# Patient Record
Sex: Female | Born: 1950
Health system: Southern US, Community
[De-identification: ages and names within clinical notes are randomized; demographics above are authoritative.]

## PROBLEM LIST (undated history)

## (undated) DIAGNOSIS — H269 Unspecified cataract: Secondary | ICD-10-CM

## (undated) DIAGNOSIS — R112 Nausea with vomiting, unspecified: Secondary | ICD-10-CM

## (undated) DIAGNOSIS — Z9889 Other specified postprocedural states: Secondary | ICD-10-CM

## (undated) DIAGNOSIS — T7840XA Allergy, unspecified, initial encounter: Secondary | ICD-10-CM

## (undated) DIAGNOSIS — R42 Dizziness and giddiness: Secondary | ICD-10-CM

## (undated) DIAGNOSIS — K219 Gastro-esophageal reflux disease without esophagitis: Secondary | ICD-10-CM

## (undated) DIAGNOSIS — N189 Chronic kidney disease, unspecified: Secondary | ICD-10-CM

## (undated) DIAGNOSIS — M1990 Unspecified osteoarthritis, unspecified site: Secondary | ICD-10-CM

## (undated) DIAGNOSIS — E785 Hyperlipidemia, unspecified: Secondary | ICD-10-CM

## (undated) HISTORY — PX: POLYPECTOMY: SHX149

## (undated) HISTORY — PX: LITHOTRIPSY: SUR834

## (undated) HISTORY — PX: FACELIFT: SHX1566

## (undated) HISTORY — PX: BACK SURGERY: SHX140

## (undated) HISTORY — DX: Chronic kidney disease, unspecified: N18.9

## (undated) HISTORY — PX: TONSILLECTOMY: SUR1361

## (undated) HISTORY — DX: Unspecified osteoarthritis, unspecified site: M19.90

## (undated) HISTORY — PX: OTHER SURGICAL HISTORY: SHX169

## (undated) HISTORY — PX: BREAST SURGERY: SHX581

## (undated) HISTORY — DX: Unspecified cataract: H26.9

## (undated) HISTORY — DX: Allergy, unspecified, initial encounter: T78.40XA

---

## 1997-09-12 ENCOUNTER — Other Ambulatory Visit: Admission: RE | Admit: 1997-09-12 | Discharge: 1997-09-12 | Payer: Self-pay | Admitting: Obstetrics and Gynecology

## 1998-10-10 ENCOUNTER — Other Ambulatory Visit: Admission: RE | Admit: 1998-10-10 | Discharge: 1998-10-10 | Payer: Self-pay | Admitting: *Deleted

## 1999-12-04 ENCOUNTER — Other Ambulatory Visit: Admission: RE | Admit: 1999-12-04 | Discharge: 1999-12-04 | Payer: Self-pay | Admitting: Obstetrics and Gynecology

## 2001-01-06 ENCOUNTER — Other Ambulatory Visit: Admission: RE | Admit: 2001-01-06 | Discharge: 2001-01-06 | Payer: Self-pay | Admitting: Obstetrics and Gynecology

## 2002-02-01 ENCOUNTER — Other Ambulatory Visit: Admission: RE | Admit: 2002-02-01 | Discharge: 2002-02-01 | Payer: Self-pay | Admitting: Obstetrics and Gynecology

## 2003-02-10 ENCOUNTER — Encounter: Admission: RE | Admit: 2003-02-10 | Discharge: 2003-02-10 | Payer: Self-pay | Admitting: Obstetrics and Gynecology

## 2003-02-10 ENCOUNTER — Encounter: Payer: Self-pay | Admitting: Obstetrics and Gynecology

## 2006-02-26 ENCOUNTER — Ambulatory Visit: Payer: Self-pay | Admitting: Gastroenterology

## 2007-06-26 ENCOUNTER — Ambulatory Visit: Payer: Self-pay | Admitting: Gastroenterology

## 2007-07-08 ENCOUNTER — Ambulatory Visit: Payer: Self-pay | Admitting: Gastroenterology

## 2008-02-05 ENCOUNTER — Encounter: Payer: Self-pay | Admitting: Internal Medicine

## 2008-10-20 ENCOUNTER — Encounter: Admission: RE | Admit: 2008-10-20 | Discharge: 2008-10-20 | Payer: Self-pay | Admitting: Obstetrics and Gynecology

## 2010-11-15 IMAGING — MG MM DIAGNOSTIC BILATERAL
9 series · 9 of 9 positions shown · non-contrast
Comparison: 02/10/2003

CLINICAL DATA: The patient had diffuse burning, stinging in the
right breast pain last week.  This has subsided.  The patient is
considering removal of breast implants.

DIGITAL DIAGNOSTIC BILATERAL MAMMOGRAM WITH CAD

[R CC]
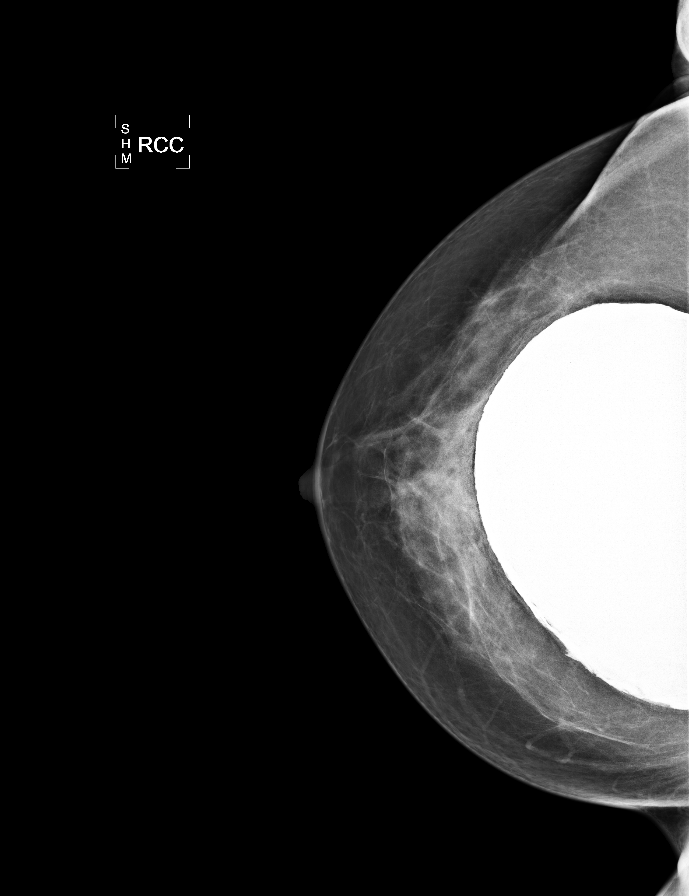

[L CC]
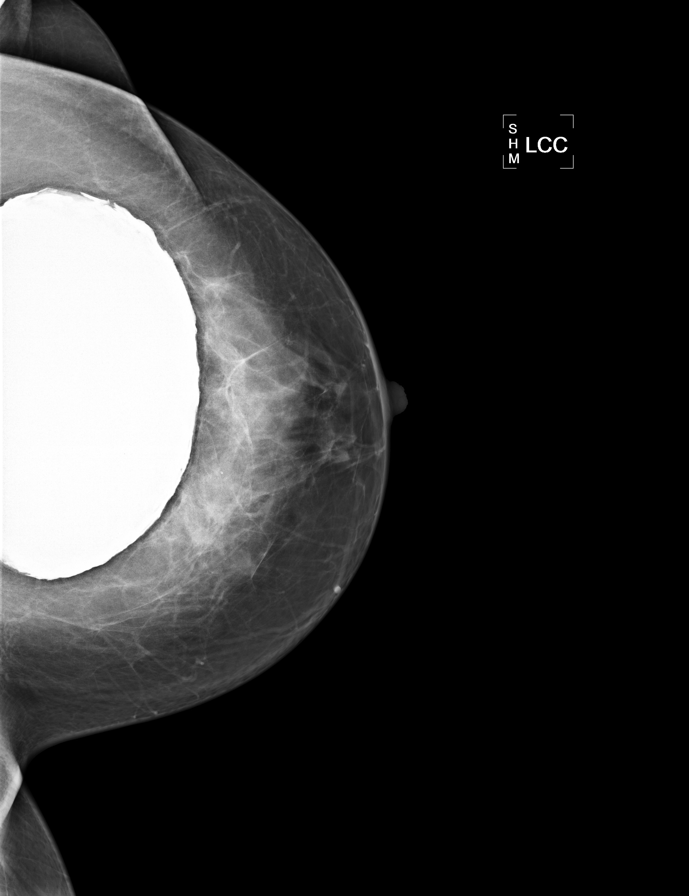

[R MLO]
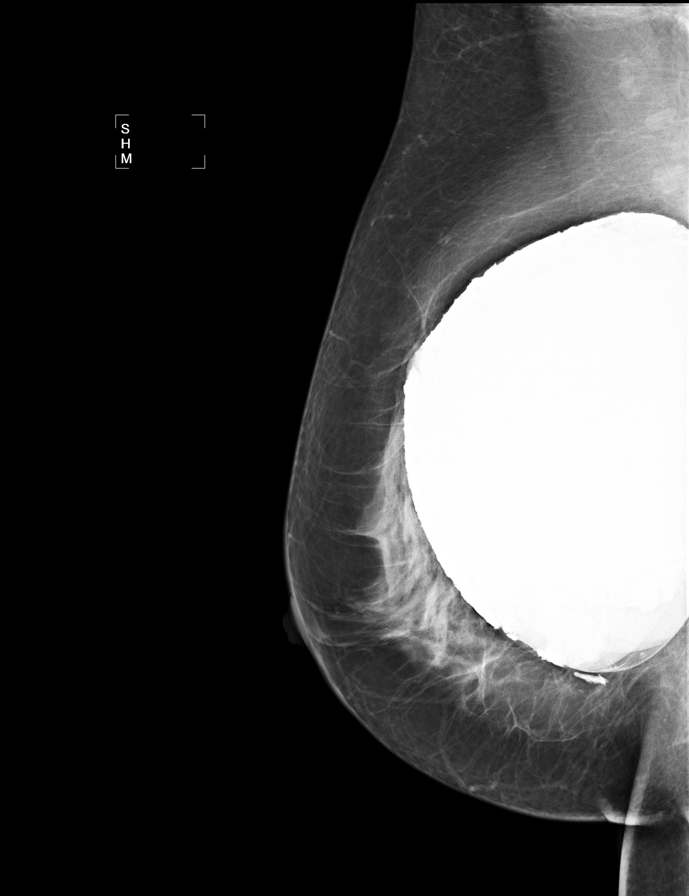

[R CCID]
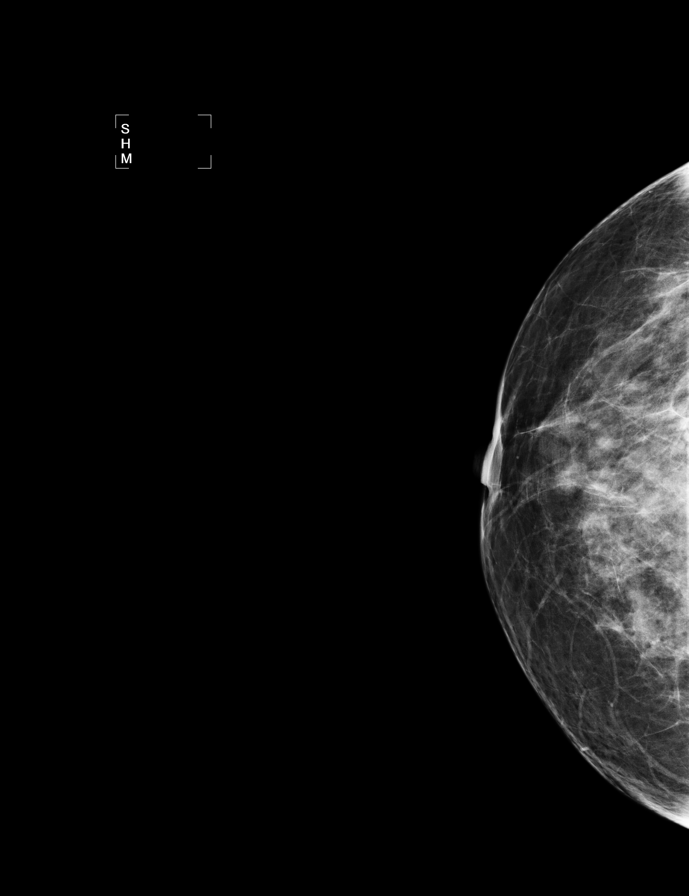

[R MLOID]
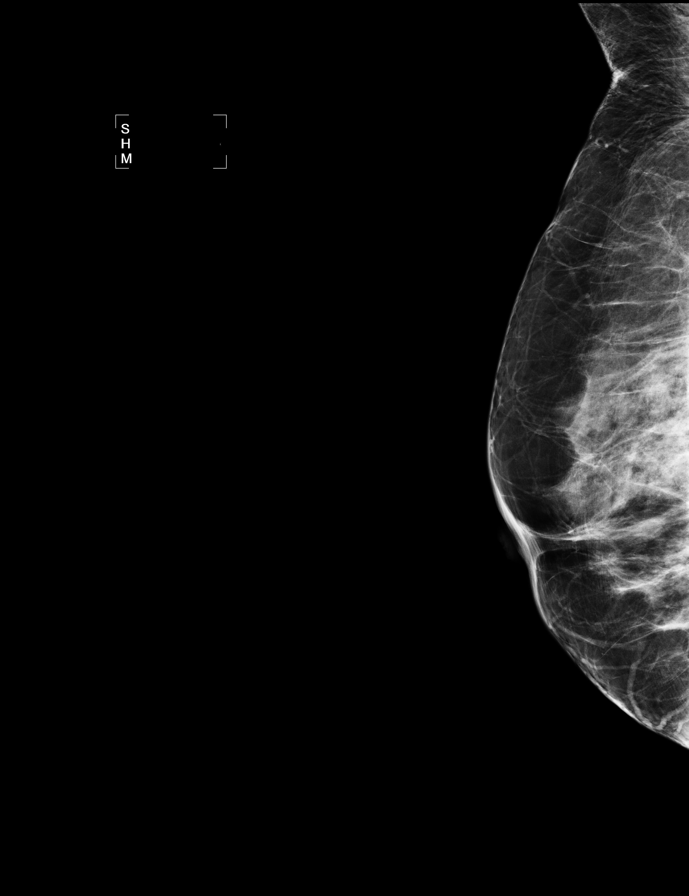

[L CCID]
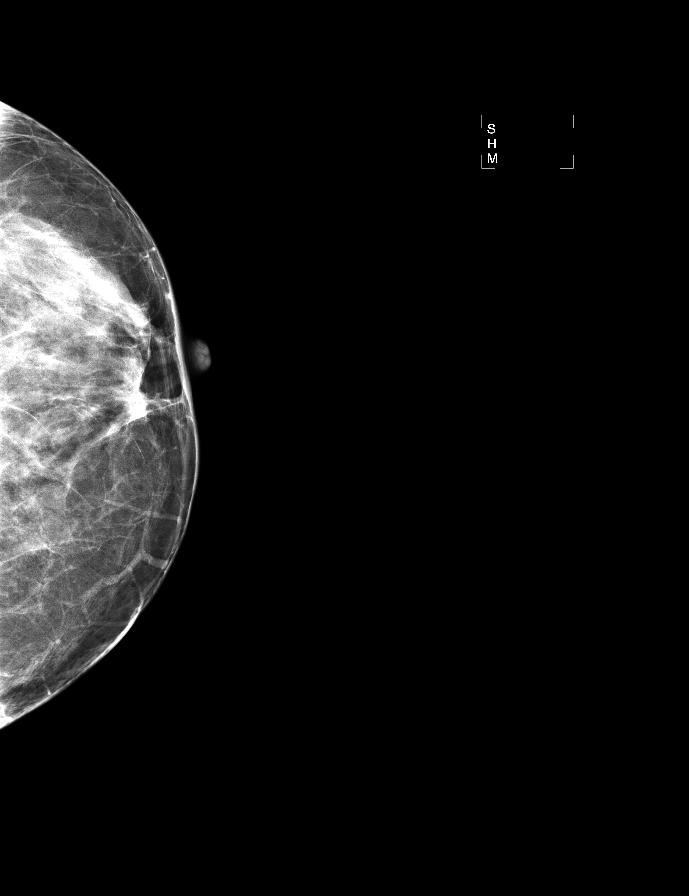

[L MLOID (1 of 2)]
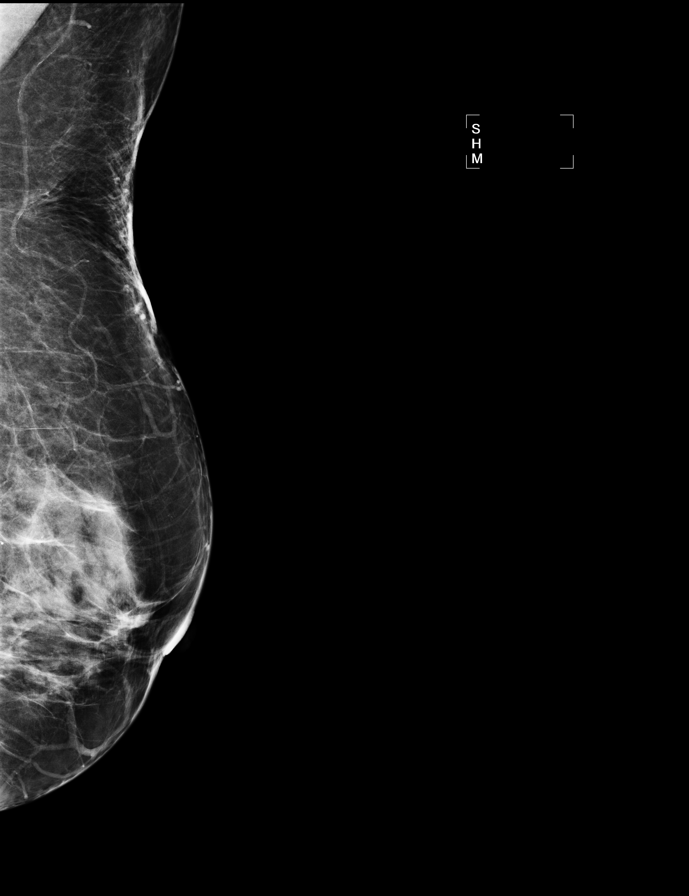

[L MLO]
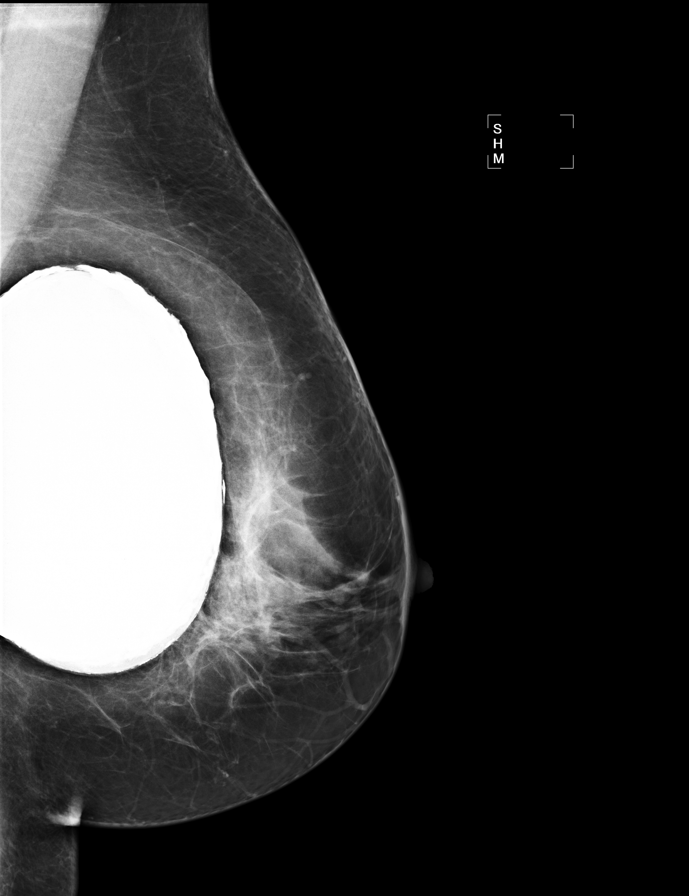

[L MLOID (2 of 2)]
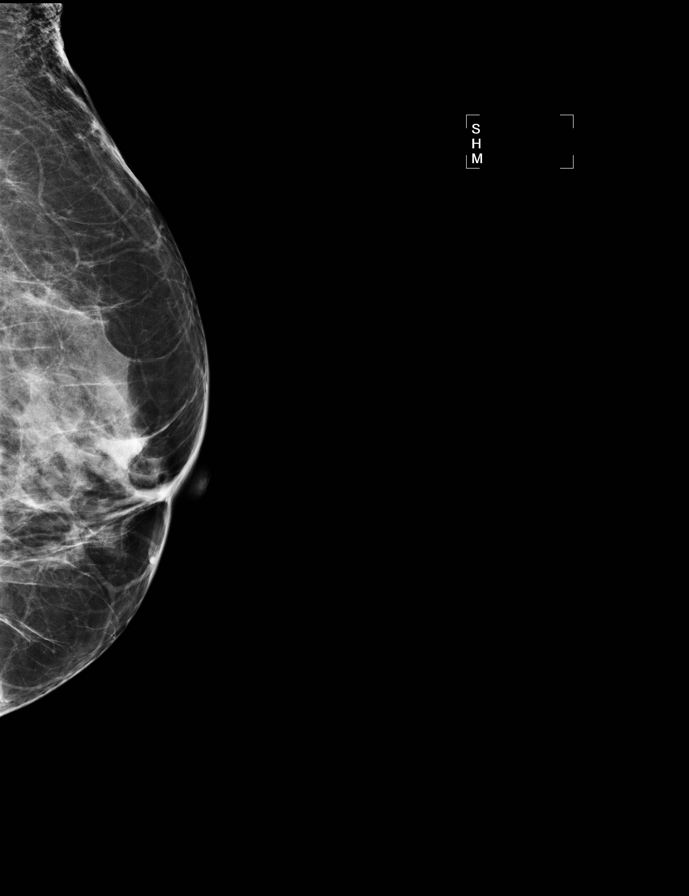

[9 of 9 positions shown; findings below may reference images not displayed]

FINDINGS: The patient has bilateral subglandular implants.  There
is capsular calcification bilaterally. No suspicious mass,
distortion, or microcalcifications are identified to suggest
presence of malignancy.
IMPRESSION: No mammographic evidence for malignancy in either breast.
Screening mammogram is suggested in 1 year.

BI-RADS CATEGORY 1:  Negative.

## 2011-11-25 ENCOUNTER — Other Ambulatory Visit: Payer: Self-pay | Admitting: Urology

## 2011-11-25 ENCOUNTER — Encounter (HOSPITAL_COMMUNITY): Payer: Self-pay | Admitting: Anesthesiology

## 2011-11-25 ENCOUNTER — Ambulatory Visit (HOSPITAL_COMMUNITY)
Admission: RE | Admit: 2011-11-25 | Discharge: 2011-11-25 | Disposition: A | Discharge status: Home / Self Care | Payer: 59 | Source: Ambulatory Visit | Attending: Urology | Admitting: Urology

## 2011-11-25 ENCOUNTER — Encounter (HOSPITAL_COMMUNITY): Payer: Self-pay | Admitting: *Deleted

## 2011-11-25 ENCOUNTER — Encounter (HOSPITAL_COMMUNITY)
Admission: RE | Disposition: A | Discharge status: Home / Self Care | Payer: Self-pay | Source: Ambulatory Visit | Attending: Urology

## 2011-11-25 ENCOUNTER — Ambulatory Visit (HOSPITAL_COMMUNITY): Payer: 59

## 2011-11-25 ENCOUNTER — Ambulatory Visit (HOSPITAL_COMMUNITY): Payer: 59 | Admitting: Anesthesiology

## 2011-11-25 DIAGNOSIS — Z79899 Other long term (current) drug therapy: Secondary | ICD-10-CM | POA: Insufficient documentation

## 2011-11-25 DIAGNOSIS — K219 Gastro-esophageal reflux disease without esophagitis: Secondary | ICD-10-CM | POA: Insufficient documentation

## 2011-11-25 DIAGNOSIS — E785 Hyperlipidemia, unspecified: Secondary | ICD-10-CM | POA: Insufficient documentation

## 2011-11-25 DIAGNOSIS — N201 Calculus of ureter: Secondary | ICD-10-CM | POA: Insufficient documentation

## 2011-11-25 HISTORY — DX: Other specified postprocedural states: R11.2

## 2011-11-25 HISTORY — DX: Hyperlipidemia, unspecified: E78.5

## 2011-11-25 HISTORY — DX: Other specified postprocedural states: Z98.890

## 2011-11-25 HISTORY — DX: Gastro-esophageal reflux disease without esophagitis: K21.9

## 2011-11-25 HISTORY — DX: Dizziness and giddiness: R42

## 2011-11-25 SURGERY — CYSTOSCOPY, WITH STENT INSERTION
Anesthesia: General | Laterality: Right | Wound class: Clean Contaminated

## 2011-11-25 MED ORDER — PROPOFOL 10 MG/ML IV EMUL
INTRAVENOUS | Status: DC | PRN
  Administered 2011-11-25: 200 mg via INTRAVENOUS
  Administered 2011-11-26: 70 mg via INTRAVENOUS

## 2011-11-25 MED ORDER — ONDANSETRON HCL 4 MG/2ML IJ SOLN
INTRAMUSCULAR | Status: DC | PRN
  Administered 2011-11-25: 4 mg via INTRAVENOUS

## 2011-11-25 MED ORDER — BELLADONNA ALKALOIDS-OPIUM 16.2-60 MG RE SUPP
RECTAL | Status: AC
  Filled 2011-11-25: qty 1

## 2011-11-25 MED ORDER — IOHEXOL 300 MG/ML  SOLN
INTRAMUSCULAR | Status: AC
  Filled 2011-11-25: qty 1

## 2011-11-25 MED ORDER — CIPROFLOXACIN IN D5W 400 MG/200ML IV SOLN
INTRAVENOUS | Status: AC
  Filled 2011-11-25: qty 200

## 2011-11-25 MED ORDER — LIDOCAINE HCL 2 % EX GEL
CUTANEOUS | Status: AC
  Filled 2011-11-25: qty 10

## 2011-11-25 MED ORDER — SCOPOLAMINE 1 MG/3DAYS TD PT72
MEDICATED_PATCH | TRANSDERMAL | Status: AC
  Filled 2011-11-25: qty 1

## 2011-11-25 MED ORDER — GLYCOPYRROLATE 0.2 MG/ML IJ SOLN
INTRAMUSCULAR | Status: DC | PRN
  Administered 2011-11-25: .4 mg via INTRAVENOUS

## 2011-11-25 MED ORDER — OXYCODONE-ACETAMINOPHEN 5-325 MG PO TABS: 1.0000 | ORAL_TABLET | ORAL | Status: AC | PRN

## 2011-11-25 MED ORDER — NEOSTIGMINE METHYLSULFATE 1 MG/ML IJ SOLN
INTRAMUSCULAR | Status: DC | PRN
  Administered 2011-11-25: 3.5 mg via INTRAVENOUS

## 2011-11-25 MED ORDER — ROCURONIUM BROMIDE 100 MG/10ML IV SOLN
INTRAVENOUS | Status: DC | PRN
  Administered 2011-11-25: 40 mg via INTRAVENOUS

## 2011-11-25 MED ORDER — CIPROFLOXACIN IN D5W 400 MG/200ML IV SOLN
400.0000 mg | INTRAVENOUS | Status: AC
  Administered 2011-11-25: 400 mg via INTRAVENOUS

## 2011-11-25 MED ORDER — FENTANYL CITRATE 0.05 MG/ML IJ SOLN
INTRAMUSCULAR | Status: DC | PRN
  Administered 2011-11-25: 100 ug via INTRAVENOUS

## 2011-11-25 MED ORDER — CIPROFLOXACIN HCL 500 MG PO TABS: 500.0000 mg | ORAL_TABLET | Freq: Two times a day (BID) | ORAL | Status: DC

## 2011-11-25 MED ORDER — SODIUM CHLORIDE 0.9 % IR SOLN
Status: DC | PRN
  Administered 2011-11-25: 3000 mL

## 2011-11-25 MED ORDER — LACTATED RINGERS IV SOLN
INTRAVENOUS | Status: DC | PRN
  Administered 2011-11-25 – 2011-11-26 (×2): via INTRAVENOUS

## 2011-11-25 MED ORDER — MIDAZOLAM HCL 5 MG/5ML IJ SOLN
INTRAMUSCULAR | Status: DC | PRN
  Administered 2011-11-25: 2 mg via INTRAVENOUS

## 2011-11-25 MED ORDER — LIDOCAINE HCL (CARDIAC) 20 MG/ML IV SOLN
INTRAVENOUS | Status: DC | PRN
  Administered 2011-11-25: 40 mg via INTRAVENOUS

## 2011-11-25 MED ORDER — HYOSCYAMINE SULFATE 0.125 MG PO TABS: 0.1250 mg | ORAL_TABLET | ORAL | Status: DC | PRN

## 2011-11-25 MED ORDER — PHENAZOPYRIDINE HCL 100 MG PO TABS: 100.0000 mg | ORAL_TABLET | Freq: Three times a day (TID) | ORAL | Status: AC | PRN

## 2011-11-25 MED ORDER — MUPIROCIN 2 % EX OINT
TOPICAL_OINTMENT | CUTANEOUS | Status: AC
  Filled 2011-11-25: qty 22

## 2011-11-25 MED ORDER — ONDANSETRON HCL 4 MG PO TABS: 4.0000 mg | ORAL_TABLET | Freq: Three times a day (TID) | ORAL | Status: DC | PRN

## 2011-11-25 MED ORDER — EPHEDRINE SULFATE 50 MG/ML IJ SOLN
INTRAMUSCULAR | Status: DC | PRN
  Administered 2011-11-25: 5 mg via INTRAVENOUS

## 2011-11-25 SURGICAL SUPPLY — 43 items
ADAPTER CATH URET PLST 4-6FR (CATHETERS) IMPLANT
BAG URINE LEG 500ML (DRAIN) ×2 IMPLANT
BAG URO CATCHER STRL LF (DRAPE) ×4 IMPLANT
BASKET LASER NITINOL 1.9FR (BASKET) IMPLANT
BASKET STNLS GEMINI 4WIRE 3FR (BASKET) IMPLANT
BASKET ZERO TIP NITINOL 2.4FR (BASKET) IMPLANT
BOSTON SCIENTIFIC ×2 IMPLANT
BRUSH URET BIOPSY 3F (UROLOGICAL SUPPLIES) IMPLANT
CANISTER SUCT LVC 12 LTR MEDI- (MISCELLANEOUS) IMPLANT
CATH CLEAR GEL 3F BACKSTOP (CATHETERS) IMPLANT
CATH INTERMIT  6FR 70CM (CATHETERS) IMPLANT
CATH URET 5FR 28IN CONE TIP (BALLOONS)
CATH URET 5FR 28IN OPEN ENDED (CATHETERS) ×2 IMPLANT
CATH URET 5FR 70CM CONE TIP (BALLOONS) IMPLANT
CATH URET DUAL LUMEN 6-10FR 50 (CATHETERS) IMPLANT
CLOTH BEACON ORANGE TIMEOUT ST (SAFETY) ×2 IMPLANT
DRAPE CAMERA CLOSED 9X96 (DRAPES) ×2 IMPLANT
ELECT REM PT RETURN 9FT ADLT (ELECTROSURGICAL)
ELECTRODE REM PT RTRN 9FT ADLT (ELECTROSURGICAL) IMPLANT
GLOVE ECLIPSE 7.0 STRL STRAW (GLOVE) ×2 IMPLANT
GLOVE INDICATOR 7.5 STRL GRN (GLOVE) IMPLANT
GLOVE SURG SS PI 8.0 STRL IVOR (GLOVE) IMPLANT
GOWN PREVENTION PLUS LG XLONG (DISPOSABLE) ×2 IMPLANT
GOWN PREVENTION PLUS XLARGE (GOWN DISPOSABLE) IMPLANT
GOWN STRL REIN XL XLG (GOWN DISPOSABLE) ×2 IMPLANT
GUIDEWIRE 0.038 PTFE COATED (WIRE) IMPLANT
GUIDEWIRE ANG ZIPWIRE 035X150 (WIRE) ×2 IMPLANT
GUIDEWIRE ANG ZIPWIRE 038X150 (WIRE) IMPLANT
GUIDEWIRE STR DUAL SENSOR (WIRE) ×2 IMPLANT
IV NS IRRIG 3000ML ARTHROMATIC (IV SOLUTION) ×2 IMPLANT
KIT BALLIN UROMAX 15FX10 (LABEL) IMPLANT
KIT BALLN UROMAX 15FX4 (MISCELLANEOUS) IMPLANT
KIT BALLN UROMAX 26 75X4 (MISCELLANEOUS)
LASER FIBER DISP (UROLOGICAL SUPPLIES) IMPLANT
MANIFOLD NEPTUNE II (INSTRUMENTS) ×2 IMPLANT
MARKER SKIN DUAL TIP RULER LAB (MISCELLANEOUS) IMPLANT
PACK CYSTO (CUSTOM PROCEDURE TRAY) ×2 IMPLANT
PACK CYSTOSCOPY (CUSTOM PROCEDURE TRAY) IMPLANT
SET HIGH PRES BAL DIL (LABEL)
SHEATH URET ACCESS 12FR/35CM (UROLOGICAL SUPPLIES) IMPLANT
SHEATH URET ACCESS 12FR/55CM (UROLOGICAL SUPPLIES) IMPLANT
SYRINGE IRR TOOMEY STRL 70CC (SYRINGE) IMPLANT
TUBING CONNECTING 10 (TUBING) ×2 IMPLANT

## 2011-11-25 NOTE — H&P (Signed)
Urology History and Physical Exam  CC: Right ureter stone  HPI: 61 year old female patient of Dr. Wrenn with a right ureter stone. It is in the distal portion of the ureter. It is 5-6mm in size. It is associated with flank pain. Her UA in clinic today was negative for signs of infection. She has severe nausea. She was seen in clinic today and management options were reviewed. She presents for cystoscopy, right ureteroscopy, laser lithotripsy, right ureter stent placement.  We have discussed the risks, benefits, alternatives, and likelihood of achieving her goals.  PMH: Past Medical History  Diagnosis Date  . PONV (postoperative nausea and vomiting)   . GERD (gastroesophageal reflux disease)   . Vertigo, intermittent     s/p head injury d/t fall 9yrs ago  . Hyperlipemia     PSH: Past Surgical History  Procedure Date  . Back surgery     Lumbar fusion 10yrs  . Tonsillectomy     age 5  . Breast surgery     augmentation 20yrs ago  . Facelift     15 yrs ago  . Lithotripsy     Allergies: Allergies  Allergen Reactions  . Codeine     Medications: Prescriptions prior to admission  Medication Sig Dispense Refill  . fish oil-omega-3 fatty acids 1000 MG capsule Take 1 g by mouth daily.      . HYDROcodone-acetaminophen (VICODIN) 5-500 MG per tablet Take 1 tablet by mouth every 6 (six) hours as needed. For pain.      . naproxen sodium (ANAPROX) 220 MG tablet Take 220 mg by mouth 2 (two) times daily as needed. For pain.      . ondansetron (ZOFRAN) 4 MG tablet Take 4 mg by mouth every 8 (eight) hours as needed.      . OVER THE COUNTER MEDICATION Take 1 tablet by mouth daily. Probiotic gummi.      . Tamsulosin HCl (FLOMAX) 0.4 MG CAPS Take 0.4 mg by mouth daily.      . vitamin B-12 (CYANOCOBALAMIN) 1000 MCG tablet Take 1,000 mcg by mouth daily.         Social History: History   Social History  . Marital Status: Married    Spouse Name: N/A    Number of Children: N/A  . Years of  Education: N/A   Occupational History  . Not on file.   Social History Main Topics  . Smoking status: Never Smoker   . Smokeless tobacco: Not on file  . Alcohol Use: 0.6 oz/week    1 Glasses of wine per week  . Drug Use: No  . Sexually Active:    Other Topics Concern  . Not on file   Social History Narrative  . No narrative on file    Family History: History reviewed. No pertinent family history.  Review of Systems: Positive: Nausea, emesis. Negative: Fever, Chest Pain, SOB.  A further 10 point review of systems was negative except what is listed in the HPI.  Physical Exam:  General: No acute distress.  Awake. Head:  Normocephalic.  Atraumatic. ENT:  EOMI.  Mucous membranes moist Neck:  Supple.  No lymphadenopathy. CV:  S1 present. S2 present. Regular rate. Pulmonary: Equal effort bilaterally.  Clear to auscultation bilaterally. Abdomen: Soft.  Non- tender to palpation. Skin:  Normal turgor.  No visible rash. Extremity: No gross deformity of bilateral upper extremities.  No gross deformity of    bilateral lower extremities. Neurologic: Alert. Appropriate mood.     Studies:  No results found for this basename: HGB:2,WBC:2,PLT:2 in the last 72 hours  No results found for this basename: NA:2,K:2,CL:2,CO2:2,BUN:2,CREATININE:2,CALCIUM:2,MAGNESIUM:2,GFRNONAA:2,GFRAA:2 in the last 72 hours   No results found for this basename: PT:2,INR:2,APTT:2 in the last 72 hours   No components found with this basename: ABG:2    Assessment:  Right distal ureter stone.  Plan: -To OR for cystoscopy, right ureteroscopy, laser lithotripsy, right ureter stent placement.   

## 2011-11-25 NOTE — Anesthesia Preprocedure Evaluation (Signed)
Anesthesia Evaluation  Patient identified by MRN, date of birth, ID band Patient awake    Reviewed: Allergy & Precautions, H&P , NPO status , Patient's Chart, lab work & pertinent test results  History of Anesthesia Complications (+) PONV  Airway Mallampati: II TM Distance: >3 FB Neck ROM: Full    Dental  (+) Teeth Intact and Dental Advisory Given   Pulmonary neg pulmonary ROS,  breath sounds clear to auscultation        Cardiovascular negative cardio ROS  Rhythm:Regular Rate:Normal     Neuro/Psych Hx of closed head injury in the remote past; negative residual negative psych ROS   GI/Hepatic Neg liver ROS, GERD-  Medicated,  Endo/Other  negative endocrine ROS  Renal/GU negative Renal ROS  negative genitourinary   Musculoskeletal negative musculoskeletal ROS (+)   Abdominal   Peds  Hematology negative hematology ROS (+)   Anesthesia Other Findings   Reproductive/Obstetrics negative OB ROS                           Anesthesia Physical Anesthesia Plan  ASA: II and Emergent  Anesthesia Plan: General   Post-op Pain Management:    Induction: Intravenous  Airway Management Planned: LMA  Additional Equipment:   Intra-op Plan:   Post-operative Plan: Extubation in OR  Informed Consent: I have reviewed the patients History and Physical, chart, labs and discussed the procedure including the risks, benefits and alternatives for the proposed anesthesia with the patient or authorized representative who has indicated his/her understanding and acceptance.   Dental advisory given  Plan Discussed with: CRNA  Anesthesia Plan Comments:         Anesthesia Quick Evaluation

## 2011-11-25 NOTE — Brief Op Note (Signed)
11/25/2011  11:49 PM  PATIENT:  Amber [name]  61 y.o. female  PRE-OPERATIVE DIAGNOSIS:  Right ureteral stone  POST-OPERATIVE DIAGNOSIS:  Right Ureteral stone  PROCEDURE:  Procedure(s) (LRB): CYSTOSCOPY WITH STENT PLACEMENT (Right)  SURGEON:  Surgeon(s) and Role:    * Milford Cage, MD - Primary  PHYSICIAN ASSISTANT:   ASSISTANTS: none   ANESTHESIA:   general  EBL:     BLOOD ADMINISTERED:none  DRAINS: none   LOCAL MEDICATIONS USED:  NONE  SPECIMEN:  No Specimen  DISPOSITION OF SPECIMEN:  N/A  COUNTS:  YES  TOURNIQUET:  * No tourniquets in log *  DICTATION: .Other Dictation: Dictation Number [id number]  PLAN OF CARE: Discharge to home after PACU  PATIENT DISPOSITION:  PACU - hemodynamically stable.   Delay start of Pharmacological VTE agent (>24hrs) due to surgical blood loss or risk of bleeding: no

## 2011-11-26 ENCOUNTER — Other Ambulatory Visit: Payer: Self-pay | Admitting: Urology

## 2011-11-26 MED ORDER — PROMETHAZINE HCL 25 MG/ML IJ SOLN: 6.2500 mg | INTRAMUSCULAR | Status: DC | PRN

## 2011-11-26 MED ORDER — LACTATED RINGERS IV SOLN: INTRAVENOUS | Status: DC

## 2011-11-26 MED ORDER — FENTANYL CITRATE 0.05 MG/ML IJ SOLN: 25.0000 ug | INTRAMUSCULAR | Status: DC | PRN

## 2011-11-26 NOTE — Op Note (Signed)
NAME:  Amber [name], Amber [name] NO.:  [id number]  MEDICAL RECORD NO.:  [id number]  LOCATION:  [facility]                         FACILITY:  [facility]  PHYSICIAN:  Natalia Leatherwood, MD    DATE OF BIRTH:  02/25/1951  DATE OF PROCEDURE:  11/25/2011 DATE OF DISCHARGE:                              OPERATIVE REPORT   SURGEON:  Natalia Leatherwood, MD  ASSISTANT:  None.  PREOPERATIVE DIAGNOSIS:  Right ureteral stone.  POSTOP DIAGNOSIS:  Right ureteral stone.  PROCEDURE PERFORMED: 1. Cystoscopy. 2. Right ureteral stent placement. 3. Fluoroscopy with interpretation less than 1 hour.  FINDINGS:  Very narrowed distal ureter with difficulty placing wire up right ureteral orifice.  COMPLICATIONS:  None.  DRAINS:  None.  SPECIMEN:  None.  HISTORY OF PRESENT ILLNESS:  This is a 61 year old female, patient of Dr. Annabell Howells who has a history of urolithiasis.  She presented with a right distal ureter stone.  She was seen in clinic today and has recommended that she have attempted ureteroscopy, she presented to the hospital this evening for that procedure.  We discussed the risks, benefits, alternatives, and likelihood of achieving goals.  PROCEDURE:  After informed consent was obtained, the patient was taken to the operating room.  She was placed in a supine position.  IV antibiotics were infused and general anesthesia was induced.  PAS hose were in place and turned on prior to induction of anesthesia.  She was then placed in dorsal lithotomy position making sure to pad all pertinent neurovascular pressure points appropriately.  Time-out was performed in which the correct patient, surgical site, and procedure were identified and agreed upon by the team.  Next, a cystoscope was advanced through the urethra into the bladder.  The ureteral orifice was visible on the right side was noted to be a very small ureteral orifice. It was attempted to be cannulated with a sensor tip wire but this  was unsuccessful likely due to edema.  Next, I had to get an angled-tip glidewire, and after trying for quite some time, I was able to pass the glidewire past the ureteral orifice and up into the right renal pelvis on fluoroscopy.  I attempted to place a 5-French ureteral catheter over this to obtain a retrograde pyelogram; however, because of the end of that was not tapered, it appeared that this would injure the distal ureter.  Therefore, I have deferred and instead opted to place a 6 x 24 double-J ureteral stent without the strings in place.  This was placed up over the wire without the strings in place.  Good curl was noted in the right renal pelvis under fluoroscopy and a curl was noted in the bladder on direct visualization.  The bladder was drained.  The patient was placed back in supine position. Anesthesia was reversed.  She was taken to the PACU in stable condition. She will be given antibiotics to cover for the instrumentation and will follow up with Dr. Annabell Howells for definitive management of her stone.          ______________________________ Natalia Leatherwood, MD     DW/MEDQ  D:  11/25/2011  T:  11/26/2011  Job:  [id number]

## 2011-11-26 NOTE — Anesthesia Postprocedure Evaluation (Signed)
Anesthesia Post Note  Patient: Amber [name]  Procedure(s) Performed: Procedure(s) (LRB): CYSTOSCOPY WITH STENT PLACEMENT (Right)  Anesthesia type: General  Patient location: PACU  Post pain: Pain level controlled  Post assessment: Post-op Vital signs reviewed  Last Vitals:  Filed Vitals:   11/26/11 0100  BP: 98/64  Pulse: 73  Temp: 36.4 C  Resp: 18    Post vital signs: Reviewed  Level of consciousness: sedated  Complications: No apparent anesthesia complications

## 2011-11-26 NOTE — Transfer of Care (Signed)
Immediate Anesthesia Transfer of Care Note  Patient: Amber [name]  Procedure(s) Performed: Procedure(s) (LRB): CYSTOSCOPY WITH STENT PLACEMENT (Right)  Patient Location: PACU  Anesthesia Type: General  Level of Consciousness: awake, alert , oriented, patient cooperative and responds to stimulation  Airway & Oxygen Therapy: Patient Spontanous Breathing and Patient connected to face mask oxygen  Post-op Assessment: Report given to PACU RN, Post -op Vital signs reviewed and stable and Patient moving all extremities X 4  Post vital signs: stable  Complications: No apparent anesthesia complications

## 2011-11-27 ENCOUNTER — Encounter (HOSPITAL_COMMUNITY): Payer: Self-pay | Admitting: Pharmacy Technician

## 2011-12-02 ENCOUNTER — Encounter (HOSPITAL_COMMUNITY)
Admission: RE | Admit: 2011-12-02 | Discharge: 2011-12-02 | Disposition: A | Discharge status: Home / Self Care | Payer: 59 | Source: Ambulatory Visit | Attending: Urology | Admitting: Urology

## 2011-12-02 ENCOUNTER — Encounter (HOSPITAL_COMMUNITY): Payer: Self-pay

## 2011-12-02 LAB — CBC
Hemoglobin: 13.7 g/dL (ref 12.0–15.0)
MCH: 28.1 pg (ref 26.0–34.0)
MCHC: 32.4 g/dL (ref 30.0–36.0)
RDW: 14 % (ref 11.5–15.5)

## 2011-12-02 LAB — SURGICAL PCR SCREEN
MRSA, PCR: NEGATIVE
Staphylococcus aureus: POSITIVE — AB

## 2011-12-02 NOTE — Patient Instructions (Addendum)
20 Amber [name]  12/02/2011   Your procedure is scheduled on:  12-03-2011  Report to [facility] at 0630  AM.  Call this number if you have problems the morning of surgery: [phone]   Remember:   Do not eat food or drink liquids:After Midnight.  .  Take these medicines the morning of surgery with A SIP OF WATER:flomax, oxycodone    Do not wear jewelry or make up.  Do not wear lotions, powders, or perfumes.Do not wear deodorant.    Do not bring valuables to the hospital.  Contacts, dentures or bridgework may not be worn into surgery.  Leave suitcase in the car. After surgery it may be brought to your room.  For patients admitted to the hospital, checkout time is 11:00 AM the day of             discharge                             Special Instructions: CHG Shower Use Special Wash: 1/2 bottle night before surgery and 1/2 bottle morning of surgery, use regular soap on face and front and back private area.   Please read over the following fact sheets that you were given: MRSA Information  [facility] pre op nurse phone number [phone], call if needed

## 2011-12-02 NOTE — Pre-Procedure Instructions (Signed)
ekg dr bonsu on chart

## 2011-12-03 ENCOUNTER — Encounter (HOSPITAL_COMMUNITY)
Admission: RE | Disposition: A | Discharge status: Home / Self Care | Payer: Self-pay | Source: Ambulatory Visit | Attending: Urology

## 2011-12-03 ENCOUNTER — Ambulatory Visit (HOSPITAL_COMMUNITY): Payer: 59 | Admitting: Anesthesiology

## 2011-12-03 ENCOUNTER — Encounter (HOSPITAL_COMMUNITY): Payer: Self-pay | Admitting: Anesthesiology

## 2011-12-03 ENCOUNTER — Ambulatory Visit (HOSPITAL_COMMUNITY)
Admission: RE | Admit: 2011-12-03 | Discharge: 2011-12-03 | Disposition: A | Discharge status: Home / Self Care | Payer: 59 | Source: Ambulatory Visit | Attending: Urology | Admitting: Urology

## 2011-12-03 ENCOUNTER — Encounter (HOSPITAL_COMMUNITY): Payer: Self-pay | Admitting: *Deleted

## 2011-12-03 DIAGNOSIS — N201 Calculus of ureter: Secondary | ICD-10-CM | POA: Insufficient documentation

## 2011-12-03 DIAGNOSIS — K219 Gastro-esophageal reflux disease without esophagitis: Secondary | ICD-10-CM | POA: Insufficient documentation

## 2011-12-03 DIAGNOSIS — E785 Hyperlipidemia, unspecified: Secondary | ICD-10-CM | POA: Insufficient documentation

## 2011-12-03 DIAGNOSIS — R42 Dizziness and giddiness: Secondary | ICD-10-CM | POA: Insufficient documentation

## 2011-12-03 DIAGNOSIS — Z79899 Other long term (current) drug therapy: Secondary | ICD-10-CM | POA: Insufficient documentation

## 2011-12-03 DIAGNOSIS — Z01812 Encounter for preprocedural laboratory examination: Secondary | ICD-10-CM | POA: Insufficient documentation

## 2011-12-03 HISTORY — PX: CYSTOSCOPY/RETROGRADE/URETEROSCOPY/STONE EXTRACTION WITH BASKET: SHX5317

## 2011-12-03 SURGERY — CYSTOSCOPY, WITH CALCULUS REMOVAL USING BASKET
Anesthesia: General | Site: Ureter | Laterality: Right | Wound class: Clean Contaminated

## 2011-12-03 MED ORDER — INDIGOTINDISULFONATE SODIUM 8 MG/ML IJ SOLN
INTRAMUSCULAR | Status: AC
  Filled 2011-12-03: qty 5

## 2011-12-03 MED ORDER — FENTANYL CITRATE 0.05 MG/ML IJ SOLN
INTRAMUSCULAR | Status: DC | PRN
  Administered 2011-12-03: 50 ug via INTRAVENOUS
  Administered 2011-12-03: 25 ug via INTRAVENOUS

## 2011-12-03 MED ORDER — OXYCODONE HCL 5 MG PO TABS
ORAL_TABLET | ORAL | Status: AC
  Filled 2011-12-03: qty 1

## 2011-12-03 MED ORDER — ONDANSETRON HCL 4 MG/2ML IJ SOLN
INTRAMUSCULAR | Status: DC | PRN
  Administered 2011-12-03: 4 mg via INTRAVENOUS

## 2011-12-03 MED ORDER — LIDOCAINE HCL 2 % EX GEL
CUTANEOUS | Status: AC
  Filled 2011-12-03: qty 10

## 2011-12-03 MED ORDER — BELLADONNA ALKALOIDS-OPIUM 16.2-60 MG RE SUPP
RECTAL | Status: DC | PRN
  Administered 2011-12-03: 1 via RECTAL

## 2011-12-03 MED ORDER — MIDAZOLAM HCL 5 MG/5ML IJ SOLN
INTRAMUSCULAR | Status: DC | PRN
  Administered 2011-12-03: 2 mg via INTRAVENOUS

## 2011-12-03 MED ORDER — ACETAMINOPHEN 10 MG/ML IV SOLN
INTRAVENOUS | Status: DC | PRN
  Administered 2011-12-03: 1000 mg via INTRAVENOUS

## 2011-12-03 MED ORDER — SODIUM CHLORIDE 0.9 % IJ SOLN: 3.0000 mL | Freq: Two times a day (BID) | INTRAMUSCULAR | Status: DC

## 2011-12-03 MED ORDER — SODIUM CHLORIDE 0.9 % IJ SOLN: 3.0000 mL | INTRAMUSCULAR | Status: DC | PRN

## 2011-12-03 MED ORDER — ONDANSETRON HCL 4 MG/2ML IJ SOLN: 4.0000 mg | Freq: Four times a day (QID) | INTRAMUSCULAR | Status: DC | PRN

## 2011-12-03 MED ORDER — CIPROFLOXACIN IN D5W 400 MG/200ML IV SOLN
INTRAVENOUS | Status: AC
  Filled 2011-12-03: qty 200

## 2011-12-03 MED ORDER — EPHEDRINE SULFATE 50 MG/ML IJ SOLN
INTRAMUSCULAR | Status: DC | PRN
  Administered 2011-12-03: 5 mg via INTRAVENOUS

## 2011-12-03 MED ORDER — LACTATED RINGERS IV SOLN
INTRAVENOUS | Status: DC
  Administered 2011-12-03: 1000 mL via INTRAVENOUS

## 2011-12-03 MED ORDER — ACETAMINOPHEN 650 MG RE SUPP
650.0000 mg | RECTAL | Status: DC | PRN
  Filled 2011-12-03: qty 1

## 2011-12-03 MED ORDER — OXYCODONE HCL 5 MG PO TABS
5.0000 mg | ORAL_TABLET | ORAL | Status: DC | PRN
  Administered 2011-12-03 (×2): 5 mg via ORAL

## 2011-12-03 MED ORDER — LIDOCAINE HCL (CARDIAC) 20 MG/ML IV SOLN
INTRAVENOUS | Status: DC | PRN
  Administered 2011-12-03: 50 mg via INTRAVENOUS

## 2011-12-03 MED ORDER — SODIUM CHLORIDE 0.9 % IV SOLN: 250.0000 mL | INTRAVENOUS | Status: DC | PRN

## 2011-12-03 MED ORDER — ACETAMINOPHEN 10 MG/ML IV SOLN
INTRAVENOUS | Status: AC
  Filled 2011-12-03: qty 100

## 2011-12-03 MED ORDER — BELLADONNA ALKALOIDS-OPIUM 16.2-60 MG RE SUPP
RECTAL | Status: AC
  Filled 2011-12-03: qty 1

## 2011-12-03 MED ORDER — IOHEXOL 300 MG/ML  SOLN
INTRAMUSCULAR | Status: AC
Start: 2011-12-03 — End: 2011-12-03
  Filled 2011-12-03: qty 1

## 2011-12-03 MED ORDER — FENTANYL CITRATE 0.05 MG/ML IJ SOLN: 25.0000 ug | INTRAMUSCULAR | Status: DC | PRN

## 2011-12-03 MED ORDER — SODIUM CHLORIDE 0.9 % IR SOLN
Status: DC | PRN
  Administered 2011-12-03: 1

## 2011-12-03 MED ORDER — DEXAMETHASONE SODIUM PHOSPHATE 4 MG/ML IJ SOLN
INTRAMUSCULAR | Status: DC | PRN
  Administered 2011-12-03: 10 mg via INTRAVENOUS

## 2011-12-03 MED ORDER — PROPOFOL 10 MG/ML IV EMUL
INTRAVENOUS | Status: DC | PRN
  Administered 2011-12-03: 125 mg via INTRAVENOUS

## 2011-12-03 MED ORDER — ACETAMINOPHEN 325 MG PO TABS: 650.0000 mg | ORAL_TABLET | ORAL | Status: DC | PRN

## 2011-12-03 MED ORDER — CIPROFLOXACIN IN D5W 400 MG/200ML IV SOLN
400.0000 mg | INTRAVENOUS | Status: AC
  Administered 2011-12-03: 400 mg via INTRAVENOUS

## 2011-12-03 SURGICAL SUPPLY — 13 items
BAG URO CATCHER STRL LF (DRAPE) ×2 IMPLANT
BASKET LASER NITINOL 1.9FR (BASKET) ×2 IMPLANT
CATH URET 5FR 28IN OPEN ENDED (CATHETERS) ×2 IMPLANT
CLOTH BEACON ORANGE TIMEOUT ST (SAFETY) ×2 IMPLANT
DRAPE CAMERA CLOSED 9X96 (DRAPES) ×2 IMPLANT
GLOVE SURG SS PI 8.0 STRL IVOR (GLOVE) ×2 IMPLANT
GOWN PREVENTION PLUS XLARGE (GOWN DISPOSABLE) ×2 IMPLANT
GOWN STRL REIN XL XLG (GOWN DISPOSABLE) ×2 IMPLANT
LASER FIBER DISP (UROLOGICAL SUPPLIES) ×2 IMPLANT
MANIFOLD NEPTUNE II (INSTRUMENTS) ×2 IMPLANT
MARKER SKIN DUAL TIP RULER LAB (MISCELLANEOUS) IMPLANT
PACK CYSTO (CUSTOM PROCEDURE TRAY) ×2 IMPLANT
TUBING CONNECTING 10 (TUBING) ×2 IMPLANT

## 2011-12-03 NOTE — Anesthesia Preprocedure Evaluation (Signed)
Anesthesia Evaluation  Patient identified by MRN, date of birth, ID band Patient awake    Reviewed: Allergy & Precautions, H&P , NPO status , Patient's Chart, lab work & pertinent test results  Airway Mallampati: II TM Distance: <3 FB Neck ROM: Full    Dental No notable dental hx.    Pulmonary neg pulmonary ROS,  breath sounds clear to auscultation  Pulmonary exam normal       Cardiovascular negative cardio ROS  Rhythm:Regular Rate:Normal     Neuro/Psych negative neurological ROS  negative psych ROS   GI/Hepatic negative GI ROS, Neg liver ROS,   Endo/Other  negative endocrine ROS  Renal/GU negative Renal ROS  negative genitourinary   Musculoskeletal negative musculoskeletal ROS (+)   Abdominal   Peds negative pediatric ROS (+)  Hematology negative hematology ROS (+)   Anesthesia Other Findings   Reproductive/Obstetrics negative OB ROS                           Anesthesia Physical Anesthesia Plan  ASA: I  Anesthesia Plan: General   Post-op Pain Management:    Induction: Intravenous  Airway Management Planned: LMA  Additional Equipment:   Intra-op Plan:   Post-operative Plan:   Informed Consent: I have reviewed the patients History and Physical, chart, labs and discussed the procedure including the risks, benefits and alternatives for the proposed anesthesia with the patient or authorized representative who has indicated his/her understanding and acceptance.   Dental advisory given  Plan Discussed with: CRNA  Anesthesia Plan Comments:         Anesthesia Quick Evaluation

## 2011-12-03 NOTE — Preoperative (Signed)
Beta Blockers   Reason not to administer Beta Blockers:Not Applicable 

## 2011-12-03 NOTE — H&P (View-Only) (Signed)
Urology History and Physical Exam  CC: Right ureter stone  HPI: 61 year old female patient of Dr. Annabell Howells with a right ureter stone. It is in the distal portion of the ureter. It is 5-18mm in size. It is associated with flank pain. Her UA in clinic today was negative for signs of infection. She has severe nausea. She was seen in clinic today and management options were reviewed. She presents for cystoscopy, right ureteroscopy, laser lithotripsy, right ureter stent placement.  We have discussed the risks, benefits, alternatives, and likelihood of achieving her goals.  PMH: Past Medical History  Diagnosis Date  . PONV (postoperative nausea and vomiting)   . GERD (gastroesophageal reflux disease)   . Vertigo, intermittent     s/p head injury d/t fall 44yrs ago  . Hyperlipemia     PSH: Past Surgical History  Procedure Date  . Back surgery     Lumbar fusion 67yrs  . Tonsillectomy     age 46  . Breast surgery     augmentation 59yrs ago  . Facelift     15 yrs ago  . Lithotripsy     Allergies: Allergies  Allergen Reactions  . Codeine     Medications: Prescriptions prior to admission  Medication Sig Dispense Refill  . fish oil-omega-3 fatty acids 1000 MG capsule Take 1 g by mouth daily.      Marland Kitchen HYDROcodone-acetaminophen (VICODIN) 5-500 MG per tablet Take 1 tablet by mouth every 6 (six) hours as needed. For pain.      . naproxen sodium (ANAPROX) 220 MG tablet Take 220 mg by mouth 2 (two) times daily as needed. For pain.      Marland Kitchen ondansetron (ZOFRAN) 4 MG tablet Take 4 mg by mouth every 8 (eight) hours as needed.      Marland Kitchen OVER THE COUNTER MEDICATION Take 1 tablet by mouth daily. Probiotic gummi.      . Tamsulosin HCl (FLOMAX) 0.4 MG CAPS Take 0.4 mg by mouth daily.      . vitamin B-12 (CYANOCOBALAMIN) 1000 MCG tablet Take 1,000 mcg by mouth daily.         Social History: History   Social History  . Marital Status: Married    Spouse Name: N/A    Number of Children: N/A  . Years of  Education: N/A   Occupational History  . Not on file.   Social History Main Topics  . Smoking status: Never Smoker   . Smokeless tobacco: Not on file  . Alcohol Use: 0.6 oz/week    1 Glasses of wine per week  . Drug Use: No  . Sexually Active:    Other Topics Concern  . Not on file   Social History Narrative  . No narrative on file    Family History: History reviewed. No pertinent family history.  Review of Systems: Positive: Nausea, emesis. Negative: Fever, Chest Pain, SOB.  A further 10 point review of systems was negative except what is listed in the HPI.  Physical Exam:  General: No acute distress.  Awake. Head:  Normocephalic.  Atraumatic. ENT:  EOMI.  Mucous membranes moist Neck:  Supple.  No lymphadenopathy. CV:  S1 present. S2 present. Regular rate. Pulmonary: Equal effort bilaterally.  Clear to auscultation bilaterally. Abdomen: Soft.  Non- tender to palpation. Skin:  Normal turgor.  No visible rash. Extremity: No gross deformity of bilateral upper extremities.  No gross deformity of    bilateral lower extremities. Neurologic: Alert. Appropriate mood.  Studies:  No results found for this basename: HGB:2,WBC:2,PLT:2 in the last 72 hours  No results found for this basename: NA:2,K:2,CL:2,CO2:2,BUN:2,CREATININE:2,CALCIUM:2,MAGNESIUM:2,GFRNONAA:2,GFRAA:2 in the last 72 hours   No results found for this basename: PT:2,INR:2,APTT:2 in the last 72 hours   No components found with this basename: ABG:2    Assessment:  Right distal ureter stone.  Plan: -To OR for cystoscopy, right ureteroscopy, laser lithotripsy, right ureter stent placement.

## 2011-12-03 NOTE — OR Nursing (Signed)
Right ureteral stone sent with dr Annabell Howells

## 2011-12-03 NOTE — Interval H&P Note (Signed)
History and Physical Interval Note:  12/03/2011 8:15 AM  Amber [name]  has presented today for surgery, with the diagnosis of Right Ureteral Stone  The various methods of treatment have been discussed with the patient and family. After consideration of risks, benefits and other options for treatment, the patient has consented to  Procedure(s) (LRB): CYSTOSCOPY/RETROGRADE/URETEROSCOPY/STONE EXTRACTION WITH BASKET (Right) HOLMIUM LASER APPLICATION (Right) as a surgical intervention .  The patient's history has been reviewed, patient examined, no change in status, stable for surgery.  I have reviewed the patients' chart and labs.  Questions were answered to the patient's satisfaction.     Hashem Goynes J

## 2011-12-03 NOTE — Anesthesia Postprocedure Evaluation (Signed)
  Anesthesia Post-op Note  Patient: Amber [name]  Procedure(s) Performed: Procedure(s) (LRB): CYSTOSCOPY/RETROGRADE/URETEROSCOPY/STONE EXTRACTION WITH BASKET (Right) HOLMIUM LASER APPLICATION (Right)  Patient Location: PACU  Anesthesia Type: General  Level of Consciousness: awake and alert   Airway and Oxygen Therapy: Patient Spontanous Breathing  Post-op Pain: mild  Post-op Assessment: Post-op Vital signs reviewed, Patient's Cardiovascular Status Stable, Respiratory Function Stable, Patent Airway and No signs of Nausea or vomiting  Post-op Vital Signs: stable  Complications: No apparent anesthesia complications

## 2011-12-03 NOTE — Transfer of Care (Signed)
Immediate Anesthesia Transfer of Care Note  Patient: Amber [name]  Procedure(s) Performed: Procedure(s) (LRB): CYSTOSCOPY/RETROGRADE/URETEROSCOPY/STONE EXTRACTION WITH BASKET (Right) HOLMIUM LASER APPLICATION (Right)  Patient Location: PACU  Anesthesia Type: General  Level of Consciousness: sedated  Airway & Oxygen Therapy: Patient Spontanous Breathing and Patient connected to face mask oxygen  Post-op Assessment: Report given to PACU RN and Post -op Vital signs reviewed and stable  Post vital signs: Reviewed and stable  Complications: No apparent anesthesia complications

## 2011-12-03 NOTE — Brief Op Note (Signed)
12/03/2011  8:58 AM  PATIENT:  Amber [name]  61 y.o. female  PRE-OPERATIVE DIAGNOSIS:  Right Ureteral Stone  POST-OPERATIVE DIAGNOSIS:  Right Ureteral Stone  PROCEDURE:  Procedure(s) (LRB): CYSTOSCOPY/URETEROSCOPY WITH LASER/STONE EXTRACTION WITH BASKET (Right) HOLMIUM LASER APPLICATION (Right)  SURGEON:  Surgeon(s) and Role:    * Anner Crete, MD - Primary  PHYSICIAN ASSISTANT:   ASSISTANTS: none   ANESTHESIA:   general  EBL:  Total I/O In: 800 [I.V.:800] Out: -   BLOOD ADMINISTERED:none  DRAINS: none   LOCAL MEDICATIONS USED:  NONE  SPECIMEN:  Source of Specimen:  right ureteral stones  DISPOSITION OF SPECIMEN:  To patient  COUNTS:  YES  TOURNIQUET:  * No tourniquets in log *  DICTATION: .Other Dictation: Dictation Number [id number]  PLAN OF CARE: Discharge to home after PACU  PATIENT DISPOSITION:  PACU - hemodynamically stable.   Delay start of Pharmacological VTE agent (>24hrs) due to surgical blood loss or risk of bleeding: no

## 2011-12-04 ENCOUNTER — Encounter (HOSPITAL_COMMUNITY): Payer: Self-pay | Admitting: Urology

## 2011-12-04 NOTE — Op Note (Signed)
[name], [name] NO.:  [id number]  MEDICAL RECORD NO.:  [id number]  LOCATION:  [facility]                         FACILITY:  [facility]  PHYSICIAN:  Excell Seltzer. Annabell Howells, M.D.    DATE OF BIRTH:  04/05/1951  DATE OF PROCEDURE: DATE OF DISCHARGE:  12/03/2011                              OPERATIVE REPORT   PROCEDURE:  Cystoscopy with removal of right double-J stent, right ureteroscopy with holmium lasertripsy and basket extraction of fragments.  PREOPERATIVE DIAGNOSIS:  Right distal ureteral stone.  POSTOPERATIVE DIAGNOSIS:  Right distal ureteral stone.  SURGEON:  Excell Seltzer. Annabell Howells, M.D.  ANESTHESIA:  General.  SPECIMEN:  Stone fragments.  DRAINS:  None.  ESTIMATED BLOOD LOSS:  Minimal.  COMPLICATIONS:  None.  INDICATIONS:  [name] is a 61 year old white female with a 6-mm right ureteral stone and underwent stenting last week for a very impacted stone.  She returns now for completion ureteroscopy.  FINDINGS FOR PROCEDURE:  She was given Cipro.  She was taken to the operating room, where general anesthetic was induced.  She was placed in lithotomy position.  Her perineum and genitalia were prepped with Betadine solution.  She was draped in usual sterile fashion.  Cystoscopy was performed using a 22-French scope and 12-degree lens.  Examination revealed the stent at the right ureteral orifice.  The stent was pulled back to the urethral meatus and a guidewire was passed to the kidney. The stone was removed over the wire.  A 6.5-French short ureteroscope was then passed alongside the wire.  The stone was visualized, and it was now loose in the ureter.  The stone was engaged with a holmium laser using the 365 micron fiber at 0.5 watts and 20 Hz.  The stone was broken into manageable fragments.  The fragments were then removed with a Nitinol basket.  Once all fragments were removed.  Inspection of proximal stone revealed a couple of small residual fragments which  were then removed as well.  Reinspection up to the level of the iliacs revealed no additional stone material. Inspection of the distal ureter revealed minimal mucosal injury from the presence of the stone.  It was felt replacement stent was not indicated.  The guidewire was then removed.  Repeat cystoscopy revealed a few residual stone fragments in the bladder.  These were evacuated.  The bladder was drained.  B and O suppository was placed.  The patient was taken down from lithotomy position.  Her anesthetic was reversed.  She was moved to recovery room in stable condition.  There were no complications.     Excell Seltzer. Annabell Howells, M.D.     JJW/MEDQ  D:  12/03/2011  T:  12/04/2011  Job:  [id number]

## 2012-06-08 ENCOUNTER — Encounter: Payer: Self-pay | Admitting: Gastroenterology

## 2012-07-08 ENCOUNTER — Encounter: Payer: Self-pay | Admitting: Gastroenterology

## 2012-08-05 ENCOUNTER — Ambulatory Visit (AMBULATORY_SURGERY_CENTER): Payer: 59

## 2012-08-05 ENCOUNTER — Encounter: Payer: Self-pay | Admitting: Gastroenterology

## 2012-08-05 VITALS — Ht 60.0 in | Wt 126.4 lb

## 2012-08-05 DIAGNOSIS — Z1211 Encounter for screening for malignant neoplasm of colon: Secondary | ICD-10-CM

## 2012-08-05 DIAGNOSIS — Z800 Family history of malignant neoplasm of digestive organs: Secondary | ICD-10-CM

## 2012-08-05 MED ORDER — NA SULFATE-K SULFATE-MG SULF 17.5-3.13-1.6 GM/177ML PO SOLN: 1.0000 | Freq: Once | ORAL | Status: DC

## 2012-08-21 ENCOUNTER — Encounter: Payer: Self-pay | Admitting: Gastroenterology

## 2012-08-21 ENCOUNTER — Ambulatory Visit (AMBULATORY_SURGERY_CENTER): Payer: 59 | Admitting: Gastroenterology

## 2012-08-21 VITALS — BP 109/68 | HR 67 | Temp 97.8°F | Resp 14 | Ht 60.0 in | Wt 126.0 lb

## 2012-08-21 DIAGNOSIS — Z1211 Encounter for screening for malignant neoplasm of colon: Secondary | ICD-10-CM

## 2012-08-21 DIAGNOSIS — D126 Benign neoplasm of colon, unspecified: Secondary | ICD-10-CM

## 2012-08-21 DIAGNOSIS — Z800 Family history of malignant neoplasm of digestive organs: Secondary | ICD-10-CM

## 2012-08-21 HISTORY — PX: COLONOSCOPY: SHX174

## 2012-08-21 MED ORDER — SODIUM CHLORIDE 0.9 % IV SOLN: 500.0000 mL | INTRAVENOUS | Status: DC

## 2012-08-21 NOTE — Progress Notes (Signed)
Stable to RR 

## 2012-08-21 NOTE — Progress Notes (Addendum)
Patient did not have preoperative order for IV antibiotic SSI prophylaxis. (G8918)  Patient did not experience any of the following events: a burn prior to discharge; a fall within the facility; wrong site/side/patient/procedure/implant event; or a hospital transfer or hospital admission upon discharge from the facility. (G8907)  

## 2012-08-21 NOTE — Patient Instructions (Addendum)
YOU HAD AN ENDOSCOPIC PROCEDURE TODAY AT THE [facility]: Refer to the procedure report that was given to you for any specific questions about what was found during the examination.  If the procedure report does not answer your questions, please call your gastroenterologist to clarify.  If you requested that your care partner not be given the details of your procedure findings, then the procedure report has been included in a sealed envelope for you to review at your convenience later.  YOU SHOULD EXPECT: Some feelings of bloating in the abdomen. Passage of more gas than usual.  Walking can help get rid of the air that was put into your GI tract during the procedure and reduce the bloating. If you had a lower endoscopy (such as a colonoscopy or flexible sigmoidoscopy) you may notice spotting of blood in your stool or on the toilet paper. If you underwent a bowel prep for your procedure, then you may not have a normal bowel movement for a few days.  DIET: Your first meal following the procedure should be a light meal and then it is ok to progress to your normal diet.  A half-sandwich or bowl of soup is an example of a good first meal.  Heavy or fried foods are harder to digest and may make you feel nauseous or bloated.  Likewise meals heavy in dairy and vegetables can cause extra gas to form and this can also increase the bloating.  Drink plenty of fluids but you should avoid alcoholic beverages for 24 hours.  ACTIVITY: Your care partner should take you home directly after the procedure.  You should plan to take it easy, moving slowly for the rest of the day.  You can resume normal activity the day after the procedure however you should NOT DRIVE or use heavy machinery for 24 hours (because of the sedation medicines used during the test).    SYMPTOMS TO REPORT IMMEDIATELY: A gastroenterologist can be reached at any hour.  During normal business hours, 8:30 AM to 5:00 PM Monday through Friday,  call [phone].  After hours and on weekends, please call the GI answering service at [phone] who will take a message and have the physician on call contact you.   Following lower endoscopy (colonoscopy or flexible sigmoidoscopy):  Excessive amounts of blood in the stool  Significant tenderness or worsening of abdominal pains  Swelling of the abdomen that is new, acute  Fever of 100F or higher    FOLLOW UP: If any biopsies were taken you will be contacted by phone or by letter within the next 1-3 weeks.  Call your gastroenterologist if you have not heard about the biopsies in 3 weeks.  Our staff will call the home number listed on your records the next business day following your procedure to check on you and address any questions or concerns that you may have at that time regarding the information given to you following your procedure. This is a courtesy call and so if there is no answer at the home number and we have not heard from you through the emergency physician on call, we will assume that you have returned to your regular daily activities without incident.  SIGNATURES/CONFIDENTIALITY: You and/or your care partner have signed paperwork which will be entered into your electronic medical record.  These signatures attest to the fact that that the information above on your After Visit Summary has been reviewed and is understood.  Full responsibility of the confidentiality   of this discharge information lies with you and/or your care-partner.     

## 2012-08-21 NOTE — Op Note (Signed)
[facility] [address] [place], [address]   COLONOSCOPY PROCEDURE REPORT  PATIENT: Amber [name], Amber [name]  MR#: [id number] BIRTHDATE: [birth year] , 62  yrs. old GENDER: Female ENDOSCOPIST: Louis Meckel, MD REFERRED XB:JYNWGN Osei-Bonsu, M.D. PROCEDURE DATE:  08/21/2012 PROCEDURE:   Colonoscopy with snare polypectomy ASA CLASS:   Class II INDICATIONS:elevated risk screening and Patient's immediate family history of colon cancer. MEDICATIONS: MAC sedation, administered by CRNA and propofol (Diprivan) 200mg  IV  DESCRIPTION OF PROCEDURE:   After the risks benefits and alternatives of the procedure were thoroughly explained, informed consent was obtained.  A digital rectal exam revealed no abnormalities of the rectum.   The LB [id number]  endoscope was introduced through the anus and advanced to the cecum, which was identified by both the appendix and ileocecal valve. No adverse events experienced.   The quality of the prep was Suprep excellent The instrument was then slowly withdrawn as the colon was fully examined.      COLON FINDINGS: A sessile polyp was found in the sigmoid colon.  A polypectomy was performed with a cold snare.  The resection was complete and the polyp tissue was completely retrieved.   The colon mucosa was otherwise normal.  Retroflexed views revealed no abnormalities. The time to cecum=3 minutes 51 seconds.  Withdrawal time=8 minutes 33 seconds.  The scope was withdrawn and the procedure completed. COMPLICATIONS: There were no complications.  ENDOSCOPIC IMPRESSION: 1.   Sessile polyp was found in the sigmoid colon; polypectomy was performed with a cold snare 2.   The colon mucosa was otherwise normal  RECOMMENDATIONS: Given your significant family history of colon cancer, you should have a repeat colonoscopy in 5 years   eSigned:  Louis Meckel, MD 08/21/2012 2:22 PM   cc:

## 2012-08-21 NOTE — Progress Notes (Signed)
Called to room to assist during endoscopic procedure.  Patient ID and intended procedure confirmed with present staff. Received instructions for my participation in the procedure from the performing physician. ewm 

## 2012-08-24 ENCOUNTER — Telehealth: Payer: Self-pay | Admitting: *Deleted

## 2012-08-24 NOTE — Telephone Encounter (Signed)
  Follow up Call-  Call back number 08/21/2012  Post procedure Call Back phone  # [phone]  Permission to leave phone message Yes     No answer, left message!

## 2012-08-29 ENCOUNTER — Encounter: Payer: Self-pay | Admitting: Gastroenterology

## 2015-01-03 ENCOUNTER — Encounter: Payer: Self-pay | Admitting: Gastroenterology

## 2016-12-06 ENCOUNTER — Encounter (HOSPITAL_BASED_OUTPATIENT_CLINIC_OR_DEPARTMENT_OTHER): Payer: Self-pay | Admitting: *Deleted

## 2016-12-06 ENCOUNTER — Emergency Department (HOSPITAL_BASED_OUTPATIENT_CLINIC_OR_DEPARTMENT_OTHER)
Admission: EM | Admit: 2016-12-06 | Discharge: 2016-12-06 | Disposition: A | Discharge status: Home / Self Care | Payer: Medicare Other | Attending: Emergency Medicine | Admitting: Emergency Medicine

## 2016-12-06 DIAGNOSIS — Z87891 Personal history of nicotine dependence: Secondary | ICD-10-CM | POA: Insufficient documentation

## 2016-12-06 DIAGNOSIS — Z79899 Other long term (current) drug therapy: Secondary | ICD-10-CM | POA: Diagnosis not present

## 2016-12-06 DIAGNOSIS — R42 Dizziness and giddiness: Secondary | ICD-10-CM | POA: Diagnosis not present

## 2016-12-06 LAB — CBC WITH DIFFERENTIAL/PLATELET
BASOS ABS: 0 10*3/uL (ref 0.0–0.1)
Basophils Relative: 0 %
EOS PCT: 1 %
Eosinophils Absolute: 0.1 10*3/uL (ref 0.0–0.7)
HCT: 45.6 % (ref 36.0–46.0)
Hemoglobin: 15.3 g/dL — ABNORMAL HIGH (ref 12.0–15.0)
LYMPHS PCT: 18 %
Lymphs Abs: 1.7 10*3/uL (ref 0.7–4.0)
MCH: 29.1 pg (ref 26.0–34.0)
MCHC: 33.6 g/dL (ref 30.0–36.0)
MCV: 86.7 fL (ref 78.0–100.0)
MONO ABS: 0.6 10*3/uL (ref 0.1–1.0)
MONOS PCT: 6 %
Neutro Abs: 7.4 10*3/uL (ref 1.7–7.7)
Neutrophils Relative %: 75 %
PLATELETS: 239 10*3/uL (ref 150–400)
RBC: 5.26 MIL/uL — ABNORMAL HIGH (ref 3.87–5.11)
RDW: 13.8 % (ref 11.5–15.5)
WBC: 9.8 10*3/uL (ref 4.0–10.5)

## 2016-12-06 LAB — BASIC METABOLIC PANEL
ANION GAP: 8 (ref 5–15)
BUN: 16 mg/dL (ref 6–20)
CO2: 28 mmol/L (ref 22–32)
Calcium: 8.8 mg/dL — ABNORMAL LOW (ref 8.9–10.3)
Chloride: 104 mmol/L (ref 101–111)
Creatinine, Ser: 0.7 mg/dL (ref 0.44–1.00)
GFR calc Af Amer: >60 mL/min (ref >60)
GLUCOSE: 80 mg/dL (ref 65–99)
Potassium: 4 mmol/L (ref 3.5–5.1)
Sodium: 140 mmol/L (ref 135–145)

## 2016-12-06 MED ORDER — DIPHENHYDRAMINE HCL 50 MG/ML IJ SOLN
25.0000 mg | Freq: Once | INTRAMUSCULAR | Status: AC
  Administered 2016-12-06: 25 mg via INTRAVENOUS
  Filled 2016-12-06: qty 1

## 2016-12-06 MED ORDER — DIAZEPAM 5 MG/ML IJ SOLN
5.0000 mg | Freq: Once | INTRAMUSCULAR | Status: AC
  Administered 2016-12-06: 5 mg via INTRAVENOUS
  Filled 2016-12-06: qty 2

## 2016-12-06 MED ORDER — MECLIZINE HCL 25 MG PO TABS: 25.0000 mg | ORAL_TABLET | Freq: Three times a day (TID) | ORAL | 0 refills | Status: AC | PRN

## 2016-12-06 MED ORDER — DIAZEPAM 5 MG PO TABS: 5.0000 mg | ORAL_TABLET | Freq: Two times a day (BID) | ORAL | 0 refills | Status: DC

## 2016-12-06 MED ORDER — ACETAMINOPHEN 325 MG PO TABS
650.0000 mg | ORAL_TABLET | Freq: Once | ORAL | Status: AC
  Administered 2016-12-06: 650 mg via ORAL
  Filled 2016-12-06: qty 2

## 2016-12-06 MED FILL — diazePAM 5 MG TABS: 5 | 5 days supply | Qty: 10 | Fill #0

## 2016-12-06 MED FILL — MECLIZINE 25 MG TABLET: 25 | 7 days supply | Qty: 20 | Fill #0

## 2016-12-06 NOTE — ED Triage Notes (Signed)
Pt reports veritgo since Wednesday evening. C/o problems with her ears this week. 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 Took dramamine without relief. States she had an episode where "everything went white" yesterday and thought she was going to pass out

## 2016-12-06 NOTE — Discharge Instructions (Signed)
Meclizine three times per day until 24 hours without dizziness. Valium at night for vertigo or sleep,

## 2016-12-06 NOTE — ED Notes (Signed)
ED Provider at bedside. 

## 2016-12-06 NOTE — ED Notes (Signed)
Pt directed to pharmacy to pick up Rx. Husband present to drive

## 2016-12-06 NOTE — ED Provider Notes (Signed)
[facility] Provider Note   CSN: [id number] Arrival date & time: 12/06/16  0810     History   Chief Complaint Chief Complaint  Patient presents with  . Dizziness    HPI Amber [name] is a 66 y.o. female.Chief complaint is vertigo  HPI 66 year old female with vertigo for the last 3 days. Is gotten to the point she is unable to stand without severe symptoms and feeling as though she is going to end up on the ground.  Patient had a severe head injury over 10 years ago. Has had vertigo since that time intermittently. Has had increasing periods of times between her episodes with her last episode being 2 years ago.  She has had left ear congestion and decreased hearing all week. She has tried Sudafed, and Zyrtec without relief. Vertigo was worse to the point this morning she tried some oral Dramamine without relief and presents here. No headache. No fever. No additional symptoms.  Past Medical History:  Diagnosis Date  . GERD (gastroesophageal reflux disease)   . Hyperlipemia   . PONV (postoperative nausea and vomiting)   . Vertigo, intermittent    s/p head injury d/t fall 27yrs ago    There are no active problems to display for this patient.   Past Surgical History:  Procedure Laterality Date  . BACK SURGERY     Lumbar fusion  L 4 to L 5 3yrs ago  . BREAST SURGERY     augmentation 39yrs ago  . CYSTOSCOPY/RETROGRADE/URETEROSCOPY/STONE EXTRACTION WITH BASKET  12/03/2011   Procedure: CYSTOSCOPY/RETROGRADE/URETEROSCOPY/STONE EXTRACTION WITH BASKET;  Surgeon: Malka So, MD;  Location: [facility];  Service: Urology;  Laterality: Right;  Right Ureteroscopy Stone Extraction  . FACELIFT     17 yrs ago  . LITHOTRIPSY    . throat biopsy  5 yrs ago  . TONSILLECTOMY     age 70    OB History    No data available       Home Medications    Prior to Admission medications   Medication Sig Start Date End Date Taking? Authorizing Provider  diazepam (VALIUM) 5 MG  tablet Take 1 tablet (5 mg total) by mouth 2 (two) times daily. 12/06/16   Tanna Furry, MD  fish oil-omega-3 fatty acids 1000 MG capsule Take 1 g by mouth daily with breakfast.    [provider]  hyoscyamine (LEVSIN, ANASPAZ) 0.125 MG tablet Take 1 tablet (0.125 mg total) by mouth every 4 (four) hours as needed for cramping (bladder spasms). 11/25/11 12/05/11  Rolan Bucco, MD  meclizine (ANTIVERT) 25 MG tablet Take 1 tablet (25 mg total) by mouth 3 (three) times daily as needed. 12/06/16   Tanna Furry, MD  ondansetron (ZOFRAN) 4 MG tablet Take 4 mg by mouth every 8 (eight) hours as needed. For nausea 11/25/11   Rolan Bucco, MD  phenazopyridine (PYRIDIUM) 100 MG tablet Take 100 mg by mouth every 8 (eight) hours.    [provider]  Tamsulosin HCl (FLOMAX) 0.4 MG CAPS Take 0.4 mg by mouth daily after breakfast.     [provider]  vitamin B-12 (CYANOCOBALAMIN) 1000 MCG tablet Take 1,000 mcg by mouth daily.    [provider]    Family History Family History  Problem Relation Age of Onset  . Colon cancer Father   . Breast cancer Sister     Social History Social History  Substance Use Topics  . Smoking status: Former Research scientist (life sciences)  . Smokeless tobacco: Never Used  .  Alcohol use 0.6 oz/week    1 Glasses of wine per week     Comment: occasional     Allergies   Codeine   Review of Systems Review of Systems  Constitutional: Negative for appetite change, chills, diaphoresis, fatigue and fever.  HENT: Negative for mouth sores, sore throat and trouble swallowing.   Eyes: Negative for visual disturbance.  Respiratory: Negative for cough, chest tightness, shortness of breath and wheezing.   Cardiovascular: Negative for chest pain.  Gastrointestinal: Negative for abdominal distention, abdominal pain, diarrhea, nausea and vomiting.  Endocrine: Negative for polydipsia, polyphagia and polyuria.  Genitourinary: Negative for dysuria, frequency and hematuria.    Musculoskeletal: Positive for gait problem.  Skin: Negative for color change, pallor and rash.  Neurological: Positive for dizziness. Negative for syncope, light-headedness and headaches.  Hematological: Does not bruise/bleed easily.  Psychiatric/Behavioral: Negative for behavioral problems and confusion.     Physical Exam Updated Vital Signs BP 119/63 (BP Location: Left Arm)   Pulse 60   Temp 99 F (37.2 C) (Oral)   Resp 16   Ht 5' (1.524 m)   Wt 57.2 kg (126 lb)   SpO2 99%   BMI 24.61 kg/m   Physical Exam  Constitutional: She is oriented to person, place, and time. She appears well-developed and well-nourished. No distress.  Patient laying left lateral decubitus. States this is her most comfortable position  HENT:  Head: Normocephalic.  Trace middle ear fluid on the left. Trace cerumen.  Eyes: Pupils are equal, round, and reactive to light. Conjunctivae are normal. No scleral icterus.  No nystagmus to lateral to lateral gaze. No extraocular movement palsy  Neck: Normal range of motion. Neck supple. No thyromegaly present.  Cardiovascular: Normal rate and regular rhythm.  Exam reveals no gallop and no friction rub.   No murmur heard. Pulmonary/Chest: Effort normal and breath sounds normal. No respiratory distress. She has no wheezes. She has no rales.  Abdominal: Soft. Bowel sounds are normal. She exhibits no distension. There is no tenderness. There is no rebound.  Musculoskeletal: Normal range of motion.  Neurological: She is alert and oriented to person, place, and time.  Skin: Skin is warm and dry. No rash noted.  Psychiatric: She has a normal mood and affect. Her behavior is normal.     ED Treatments / Results  Labs (all labs ordered are listed, but only abnormal results are displayed) Labs Reviewed  CBC WITH DIFFERENTIAL/PLATELET - Abnormal; Notable for the following:       Result Value   RBC 5.26 (*)    Hemoglobin 15.3 (*)    All other components within  normal limits  BASIC METABOLIC PANEL - Abnormal; Notable for the following:    Calcium 8.8 (*)    All other components within normal limits    EKG  EKG Interpretation  Date/Time:  Friday December 06 2016 09:00:19 EDT Ventricular Rate:  61 PR Interval:    QRS Duration: 92 QT Interval:  426 QTC Calculation: 430 R Axis:   74 Text Interpretation:  Sinus rhythm Confirmed by Tanna Furry [phone]) on 12/06/2016 9:05:11 AM       Radiology No results found.  Procedures Procedures (including critical care time)  Medications Ordered in ED Medications  diphenhydrAMINE (BENADRYL) injection 25 mg (25 mg Intravenous Given 12/06/16 0921)  diazepam (VALIUM) injection 5 mg (5 mg Intravenous Given 12/06/16 0915)  acetaminophen (TYLENOL) tablet 650 mg (650 mg Oral Given 12/06/16 1015)     Initial Impression /  Assessment and Plan / ED Course  I have reviewed the triage vital signs and the nursing notes.  Pertinent labs & imaging results that were available during my care of the patient were reviewed by me and considered in my medical decision making (see chart for details).   vertigo. Likely peripheral/recurrent. Has had Eppley's performed in the past with ENT. Plan vestibular suppression with Benadryl, and Valium IV. EKG electrolytes. Will reassess.  Final Clinical Impressions(s) / ED Diagnoses   Final diagnoses:  Vertigo   Patient able to ambulate to the bathroom with a cyst. Appropriate for discharge home.  New Prescriptions New Prescriptions   DIAZEPAM (VALIUM) 5 MG TABLET    Take 1 tablet (5 mg total) by mouth 2 (two) times daily.   MECLIZINE (ANTIVERT) 25 MG TABLET    Take 1 tablet (25 mg total) by mouth 3 (three) times daily as needed.     Tanna Furry, MD 12/06/16 1021

## 2017-08-19 DIAGNOSIS — H00012 Hordeolum externum right lower eyelid: Secondary | ICD-10-CM | POA: Diagnosis not present

## 2017-08-20 DIAGNOSIS — H0012 Chalazion right lower eyelid: Secondary | ICD-10-CM | POA: Diagnosis not present

## 2017-09-02 ENCOUNTER — Encounter: Payer: Self-pay | Admitting: Gastroenterology

## 2017-09-16 ENCOUNTER — Encounter: Payer: Self-pay | Admitting: Gastroenterology

## 2017-11-10 ENCOUNTER — Other Ambulatory Visit: Payer: Self-pay

## 2017-11-10 ENCOUNTER — Ambulatory Visit (AMBULATORY_SURGERY_CENTER): Payer: Self-pay | Admitting: *Deleted

## 2017-11-10 VITALS — Ht 60.0 in | Wt 133.0 lb

## 2017-11-10 DIAGNOSIS — Z800 Family history of malignant neoplasm of digestive organs: Secondary | ICD-10-CM

## 2017-11-10 DIAGNOSIS — Z86010 Personal history of colonic polyps: Secondary | ICD-10-CM

## 2017-11-10 NOTE — Progress Notes (Signed)
Patient denies any allergies to eggs or soy. Patient denies any problems with anesthesia/sedation.patient has post-op nausea and vomiting. Pt unable to lay head flat. Patient denies any oxygen use at home. Patient denies taking any diet/weight loss medications or blood thinners. EMMI education offered, pt declined.

## 2017-11-26 ENCOUNTER — Encounter: Payer: Self-pay | Admitting: Gastroenterology

## 2017-11-26 ENCOUNTER — Ambulatory Visit (AMBULATORY_SURGERY_CENTER): Payer: Medicare Other | Admitting: Gastroenterology

## 2017-11-26 ENCOUNTER — Other Ambulatory Visit: Payer: Self-pay

## 2017-11-26 VITALS — BP 109/69 | HR 54 | Temp 98.0°F | Resp 12 | Ht 60.0 in | Wt 133.0 lb

## 2017-11-26 DIAGNOSIS — D122 Benign neoplasm of ascending colon: Secondary | ICD-10-CM | POA: Diagnosis not present

## 2017-11-26 DIAGNOSIS — Z86010 Personal history of colonic polyps: Secondary | ICD-10-CM

## 2017-11-26 DIAGNOSIS — D123 Benign neoplasm of transverse colon: Secondary | ICD-10-CM | POA: Diagnosis not present

## 2017-11-26 DIAGNOSIS — Z1211 Encounter for screening for malignant neoplasm of colon: Secondary | ICD-10-CM | POA: Diagnosis not present

## 2017-11-26 MED ORDER — SODIUM CHLORIDE 0.9 % IV SOLN: 500.0000 mL | Freq: Once | INTRAVENOUS | Status: DC

## 2017-11-26 NOTE — Progress Notes (Signed)
Called to room to assist during endoscopic procedure.  Patient ID and intended procedure confirmed with present staff. Received instructions for my participation in the procedure from the performing physician.  

## 2017-11-26 NOTE — Progress Notes (Signed)
To PACU, VSS. Report to [email or web address] 

## 2017-11-26 NOTE — Op Note (Signed)
[place] Patient Name: Amber [name] Procedure Date: 11/26/2017 8:54 AM MRN: [id number] Endoscopist: Remo Lipps P. Havery Moros , MD Age: 67 Referring MD:  Date of Birth: 07-21-1950 Gender: Female Account #: [id number] Procedure:                Colonoscopy Indications:              Surveillance: Personal history of adenomatous                            polyps on last colonoscopy 5 years ago, father with                            colon cancer diagnosed age 59s Medicines:                Monitored Anesthesia Care Procedure:                Pre-Anesthesia Assessment:                           - Prior to the procedure, a History and Physical                            was performed, and patient medications and                            allergies were reviewed. The patient's tolerance of                            previous anesthesia was also reviewed. The risks                            and benefits of the procedure and the sedation                            options and risks were discussed with the patient.                            All questions were answered, and informed consent                            was obtained. Prior Anticoagulants: The patient has                            taken no previous anticoagulant or antiplatelet                            agents. ASA Grade Assessment: II - A patient with                            mild systemic disease. After reviewing the risks                            and benefits, the patient was deemed in  satisfactory condition to undergo the procedure.                           After obtaining informed consent, the colonoscope                            was passed under direct vision. Throughout the                            procedure, the patient's blood pressure, pulse, and                            oxygen saturations were monitored continuously. The                            Colonoscope was  introduced through the anus and                            advanced to the the cecum, identified by                            appendiceal orifice and ileocecal valve. The                            colonoscopy was performed without difficulty. The                            patient tolerated the procedure well. The quality                            of the bowel preparation was good. The ileocecal                            valve, appendiceal orifice, and rectum were                            photographed. Scope In: 9:02:09 AM Scope Out: 9:25:16 AM Scope Withdrawal Time: 0 hours 17 minutes 39 seconds  Total Procedure Duration: 0 hours 23 minutes 7 seconds  Findings:                 The perianal and digital rectal examinations were                            normal.                           A 4 mm polyp was found in the ascending colon. The                            polyp was sessile. The polyp was removed with a                            cold snare. Resection and retrieval were complete.  A 4 mm polyp was found in the hepatic flexure. The                            polyp was sessile. The polyp was removed with a                            cold snare. Resection and retrieval were complete.                           Two sessile polyps were found in the transverse                            colon. The polyps were 4 mm in size. These polyps                            were removed with a cold snare. Resection and                            retrieval were complete.                           Internal hemorrhoids were found.                           The colon was tortuous.                           Of note, I thought a photo of the IC valve was                            taken but it was not. Retroflexed views of the                            rectum not obtained due to small size of the                            rectum. The exam was otherwise without  abnormality. Complications:            No immediate complications. Estimated blood loss:                            Minimal. Estimated Blood Loss:     Estimated blood loss was minimal. Impression:               - One 4 mm polyp in the ascending colon, removed                            with a cold snare. Resected and retrieved.                           - One 4 mm polyp at the hepatic flexure, removed  with a cold snare. Resected and retrieved.                           - Two 4 mm polyps in the transverse colon, removed                            with a cold snare. Resected and retrieved.                           - Internal hemorrhoids.                           - Tortuous colon.                           - The examination was otherwise normal. Recommendation:           - Patient has a contact number available for                            emergencies. The signs and symptoms of potential                            delayed complications were discussed with the                            patient. Return to normal activities tomorrow.                            Written discharge instructions were provided to the                            patient.                           - Resume previous diet.                           - Continue present medications.                           - Await pathology results.                           - Repeat colonoscopy for surveillance based on                            pathology results. Remo Lipps P. Tranisha Tissue, MD 11/26/2017 9:30:07 AM This report has been signed electronically.

## 2017-11-26 NOTE — Patient Instructions (Signed)
YOU HAD AN ENDOSCOPIC PROCEDURE TODAY AT [facility]:   Refer to the procedure report that was given to you for any specific questions about what was found during the examination.  If the procedure report does not answer your questions, please call your gastroenterologist to clarify.  If you requested that your care partner not be given the details of your procedure findings, then the procedure report has been included in a sealed envelope for you to review at your convenience later.  YOU SHOULD EXPECT: Some feelings of bloating in the abdomen. Passage of more gas than usual.  Walking can help get rid of the air that was put into your GI tract during the procedure and reduce the bloating. If you had a lower endoscopy (such as a colonoscopy or flexible sigmoidoscopy) you may notice spotting of blood in your stool or on the toilet paper. If you underwent a bowel prep for your procedure, you may not have a normal bowel movement for a few days.  Please Note:  You might notice some irritation and congestion in your nose or some drainage.  This is from the oxygen used during your procedure.  There is no need for concern and it should clear up in a day or so.  SYMPTOMS TO REPORT IMMEDIATELY:   Following lower endoscopy (colonoscopy or flexible sigmoidoscopy):  Excessive amounts of blood in the stool  Significant tenderness or worsening of abdominal pains  Swelling of the abdomen that is new, acute  Fever of 100F or higher  Please see handouts given to you on Polyps and hemorrhoids today.   For urgent or emergent issues, a gastroenterologist can be reached at any hour by calling [phone].   DIET:  We do recommend a small meal at first, but then you may proceed to your regular diet.  Drink plenty of fluids but you should avoid alcoholic beverages for 24 hours.  ACTIVITY:  You should plan to take it easy for the rest of today and you should NOT DRIVE or use heavy machinery until  tomorrow (because of the sedation medicines used during the test).    FOLLOW UP: Our staff will call the number listed on your records the next business day following your procedure to check on you and address any questions or concerns that you may have regarding the information given to you following your procedure. If we do not reach you, we will leave a message.  However, if you are feeling well and you are not experiencing any problems, there is no need to return our call.  We will assume that you have returned to your regular daily activities without incident.  If any biopsies were taken you will be contacted by phone or by letter within the next 1-3 weeks.  Please call us at [phone] if you have not heard about the biopsies in 3 weeks.    SIGNATURES/CONFIDENTIALITY: You and/or your care partner have signed paperwork which will be entered into your electronic medical record.  These signatures attest to the fact that that the information above on your After Visit Summary has been reviewed and is understood.  Full responsibility of the confidentiality of this discharge information lies with you and/or your care-partner.  Thank you for letting us take care of your healthcare needs today.

## 2017-11-26 NOTE — Progress Notes (Signed)
Pt's states no medical or surgical changes since previsit or office visit. 

## 2017-11-27 ENCOUNTER — Telehealth: Payer: Self-pay

## 2017-11-27 NOTE — Telephone Encounter (Signed)
  Follow up Call-  Call back number 11/26/2017  Post procedure Call Back phone  # [phone]  Permission to leave phone message Yes  Some recent data might be hidden     Patient questions:  Do you have a fever, pain , or abdominal swelling? No. Pain Score  0 *  Have you tolerated food without any problems? Yes.    Have you been able to return to your normal activities? Yes.    Do you have any questions about your discharge instructions: Diet   No. Medications  No. Follow up visit  No.  Do you have questions or concerns about your Care? No.  Actions: * If pain score is 4 or above: No action needed, pain <4.

## 2017-12-05 ENCOUNTER — Encounter: Payer: Self-pay | Admitting: Gastroenterology

## 2018-01-12 DIAGNOSIS — Z0000 Encounter for general adult medical examination without abnormal findings: Secondary | ICD-10-CM | POA: Diagnosis not present

## 2018-01-12 DIAGNOSIS — E785 Hyperlipidemia, unspecified: Secondary | ICD-10-CM | POA: Diagnosis not present

## 2018-01-12 DIAGNOSIS — Z131 Encounter for screening for diabetes mellitus: Secondary | ICD-10-CM | POA: Diagnosis not present

## 2018-01-12 DIAGNOSIS — M79642 Pain in left hand: Secondary | ICD-10-CM | POA: Diagnosis not present

## 2018-01-12 DIAGNOSIS — M25522 Pain in left elbow: Secondary | ICD-10-CM | POA: Diagnosis not present

## 2018-01-12 DIAGNOSIS — M79641 Pain in right hand: Secondary | ICD-10-CM | POA: Diagnosis not present

## 2018-01-12 DIAGNOSIS — Z01118 Encounter for examination of ears and hearing with other abnormal findings: Secondary | ICD-10-CM | POA: Diagnosis not present

## 2018-01-12 DIAGNOSIS — Z136 Encounter for screening for cardiovascular disorders: Secondary | ICD-10-CM | POA: Diagnosis not present

## 2018-01-26 DIAGNOSIS — R7989 Other specified abnormal findings of blood chemistry: Secondary | ICD-10-CM | POA: Diagnosis not present

## 2018-01-26 DIAGNOSIS — E785 Hyperlipidemia, unspecified: Secondary | ICD-10-CM | POA: Diagnosis not present

## 2018-04-20 DIAGNOSIS — Z23 Encounter for immunization: Secondary | ICD-10-CM | POA: Diagnosis not present

## 2018-04-20 DIAGNOSIS — R739 Hyperglycemia, unspecified: Secondary | ICD-10-CM | POA: Diagnosis not present

## 2018-04-20 DIAGNOSIS — Z0000 Encounter for general adult medical examination without abnormal findings: Secondary | ICD-10-CM | POA: Diagnosis not present

## 2018-04-20 DIAGNOSIS — I739 Peripheral vascular disease, unspecified: Secondary | ICD-10-CM | POA: Diagnosis not present

## 2018-04-20 DIAGNOSIS — E7800 Pure hypercholesterolemia, unspecified: Secondary | ICD-10-CM | POA: Diagnosis not present

## 2018-04-20 DIAGNOSIS — R7989 Other specified abnormal findings of blood chemistry: Secondary | ICD-10-CM | POA: Diagnosis not present

## 2018-06-09 DIAGNOSIS — L239 Allergic contact dermatitis, unspecified cause: Secondary | ICD-10-CM | POA: Diagnosis not present

## 2018-12-08 DIAGNOSIS — Z6825 Body mass index (BMI) 25.0-25.9, adult: Secondary | ICD-10-CM | POA: Diagnosis not present

## 2018-12-08 DIAGNOSIS — Z20828 Contact with and (suspected) exposure to other viral communicable diseases: Secondary | ICD-10-CM | POA: Diagnosis not present

## 2019-03-18 DIAGNOSIS — D3132 Benign neoplasm of left choroid: Secondary | ICD-10-CM | POA: Diagnosis not present

## 2019-03-18 DIAGNOSIS — H2513 Age-related nuclear cataract, bilateral: Secondary | ICD-10-CM | POA: Diagnosis not present

## 2019-04-21 DIAGNOSIS — Z1231 Encounter for screening mammogram for malignant neoplasm of breast: Secondary | ICD-10-CM | POA: Diagnosis not present

## 2019-04-21 DIAGNOSIS — Z01419 Encounter for gynecological examination (general) (routine) without abnormal findings: Secondary | ICD-10-CM | POA: Diagnosis not present

## 2019-04-21 DIAGNOSIS — Z6827 Body mass index (BMI) 27.0-27.9, adult: Secondary | ICD-10-CM | POA: Diagnosis not present

## 2019-05-15 DIAGNOSIS — Z20828 Contact with and (suspected) exposure to other viral communicable diseases: Secondary | ICD-10-CM | POA: Diagnosis not present

## 2019-05-17 DIAGNOSIS — Z6825 Body mass index (BMI) 25.0-25.9, adult: Secondary | ICD-10-CM | POA: Diagnosis not present

## 2019-05-17 DIAGNOSIS — Z20828 Contact with and (suspected) exposure to other viral communicable diseases: Secondary | ICD-10-CM | POA: Diagnosis not present

## 2019-05-28 DIAGNOSIS — Z20822 Contact with and (suspected) exposure to covid-19: Secondary | ICD-10-CM | POA: Diagnosis not present

## 2019-06-05 DIAGNOSIS — Z6825 Body mass index (BMI) 25.0-25.9, adult: Secondary | ICD-10-CM | POA: Diagnosis not present

## 2019-06-05 DIAGNOSIS — Z20822 Contact with and (suspected) exposure to covid-19: Secondary | ICD-10-CM | POA: Diagnosis not present

## 2019-06-05 DIAGNOSIS — U071 COVID-19: Secondary | ICD-10-CM | POA: Diagnosis not present

## 2020-01-18 DIAGNOSIS — M1712 Unilateral primary osteoarthritis, left knee: Secondary | ICD-10-CM | POA: Diagnosis not present

## 2020-01-21 DIAGNOSIS — D3132 Benign neoplasm of left choroid: Secondary | ICD-10-CM | POA: Diagnosis not present

## 2020-01-21 DIAGNOSIS — H353131 Nonexudative age-related macular degeneration, bilateral, early dry stage: Secondary | ICD-10-CM | POA: Diagnosis not present

## 2020-01-21 DIAGNOSIS — H2513 Age-related nuclear cataract, bilateral: Secondary | ICD-10-CM | POA: Diagnosis not present

## 2020-01-21 DIAGNOSIS — H43813 Vitreous degeneration, bilateral: Secondary | ICD-10-CM | POA: Diagnosis not present

## 2020-02-21 DIAGNOSIS — Z20828 Contact with and (suspected) exposure to other viral communicable diseases: Secondary | ICD-10-CM | POA: Diagnosis not present

## 2020-03-03 DIAGNOSIS — Z1389 Encounter for screening for other disorder: Secondary | ICD-10-CM | POA: Diagnosis not present

## 2020-03-03 DIAGNOSIS — E7800 Pure hypercholesterolemia, unspecified: Secondary | ICD-10-CM | POA: Diagnosis not present

## 2020-03-03 DIAGNOSIS — Z131 Encounter for screening for diabetes mellitus: Secondary | ICD-10-CM | POA: Diagnosis not present

## 2020-03-03 DIAGNOSIS — Z0001 Encounter for general adult medical examination with abnormal findings: Secondary | ICD-10-CM | POA: Diagnosis not present

## 2020-03-03 DIAGNOSIS — Z136 Encounter for screening for cardiovascular disorders: Secondary | ICD-10-CM | POA: Diagnosis not present

## 2020-03-03 DIAGNOSIS — Z1329 Encounter for screening for other suspected endocrine disorder: Secondary | ICD-10-CM | POA: Diagnosis not present

## 2020-03-03 DIAGNOSIS — Z1321 Encounter for screening for nutritional disorder: Secondary | ICD-10-CM | POA: Diagnosis not present

## 2020-05-29 DIAGNOSIS — E7800 Pure hypercholesterolemia, unspecified: Secondary | ICD-10-CM | POA: Diagnosis not present

## 2020-07-27 DIAGNOSIS — E7800 Pure hypercholesterolemia, unspecified: Secondary | ICD-10-CM | POA: Diagnosis not present

## 2020-11-23 DIAGNOSIS — Z0001 Encounter for general adult medical examination with abnormal findings: Secondary | ICD-10-CM | POA: Diagnosis not present

## 2020-11-23 DIAGNOSIS — Z131 Encounter for screening for diabetes mellitus: Secondary | ICD-10-CM | POA: Diagnosis not present

## 2020-11-23 DIAGNOSIS — E7800 Pure hypercholesterolemia, unspecified: Secondary | ICD-10-CM | POA: Diagnosis not present

## 2020-11-23 DIAGNOSIS — Z1389 Encounter for screening for other disorder: Secondary | ICD-10-CM | POA: Diagnosis not present

## 2020-11-23 DIAGNOSIS — Z136 Encounter for screening for cardiovascular disorders: Secondary | ICD-10-CM | POA: Diagnosis not present

## 2020-11-23 DIAGNOSIS — Z1321 Encounter for screening for nutritional disorder: Secondary | ICD-10-CM | POA: Diagnosis not present

## 2020-11-23 DIAGNOSIS — Z1329 Encounter for screening for other suspected endocrine disorder: Secondary | ICD-10-CM | POA: Diagnosis not present

## 2021-01-28 ENCOUNTER — Encounter: Payer: Self-pay | Admitting: Gastroenterology

## 2021-03-23 DIAGNOSIS — Z1329 Encounter for screening for other suspected endocrine disorder: Secondary | ICD-10-CM | POA: Diagnosis not present

## 2021-03-23 DIAGNOSIS — Z1389 Encounter for screening for other disorder: Secondary | ICD-10-CM | POA: Diagnosis not present

## 2021-03-23 DIAGNOSIS — Z131 Encounter for screening for diabetes mellitus: Secondary | ICD-10-CM | POA: Diagnosis not present

## 2021-03-23 DIAGNOSIS — E559 Vitamin D deficiency, unspecified: Secondary | ICD-10-CM | POA: Diagnosis not present

## 2021-03-23 DIAGNOSIS — Z0001 Encounter for general adult medical examination with abnormal findings: Secondary | ICD-10-CM | POA: Diagnosis not present

## 2021-03-23 DIAGNOSIS — E7800 Pure hypercholesterolemia, unspecified: Secondary | ICD-10-CM | POA: Diagnosis not present

## 2021-03-23 DIAGNOSIS — Z1321 Encounter for screening for nutritional disorder: Secondary | ICD-10-CM | POA: Diagnosis not present

## 2021-03-23 DIAGNOSIS — E509 Vitamin A deficiency, unspecified: Secondary | ICD-10-CM | POA: Diagnosis not present

## 2021-03-23 DIAGNOSIS — Z136 Encounter for screening for cardiovascular disorders: Secondary | ICD-10-CM | POA: Diagnosis not present

## 2021-03-30 ENCOUNTER — Encounter: Payer: Self-pay | Admitting: Gastroenterology

## 2021-05-23 ENCOUNTER — Other Ambulatory Visit: Payer: Self-pay

## 2021-05-23 ENCOUNTER — Ambulatory Visit (AMBULATORY_SURGERY_CENTER): Payer: BLUE CROSS/BLUE SHIELD | Admitting: *Deleted

## 2021-05-23 VITALS — Ht 60.0 in | Wt 125.0 lb

## 2021-05-23 DIAGNOSIS — Z86010 Personal history of colonic polyps: Secondary | ICD-10-CM

## 2021-05-23 DIAGNOSIS — Z800 Family history of malignant neoplasm of digestive organs: Secondary | ICD-10-CM

## 2021-05-23 NOTE — Progress Notes (Signed)

## 2021-05-29 ENCOUNTER — Telehealth: Payer: Self-pay | Admitting: Gastroenterology

## 2021-05-29 NOTE — Telephone Encounter (Signed)
Patient called stating she was having issues with vertigo and wanted to speak with someone to see if she should reschedule her procedure.  Please call.  Thank you.

## 2021-05-29 NOTE — Telephone Encounter (Signed)
Patient was given medication from PCP for vertigo so she requested to be placed back on schedule for WED 1/18 at 8 am. Pt scheduled-she will call us back on Friday if she does not feel like having the procedure.

## 2021-05-29 NOTE — Telephone Encounter (Signed)
Patient request to cancel procedure on 1/18, she will call us back to reschedule once March schedule is available. Pt placed on wait list also.

## 2021-05-29 NOTE — Telephone Encounter (Signed)
Patient called stating that she had just spoke with you, requesting a call back whenever you get the chance to discuss procedure. Please advise

## 2021-06-01 ENCOUNTER — Encounter: Payer: Self-pay | Admitting: Gastroenterology

## 2021-06-05 ENCOUNTER — Encounter: Payer: Self-pay | Admitting: Certified Registered Nurse Anesthetist

## 2021-06-06 ENCOUNTER — Ambulatory Visit (AMBULATORY_SURGERY_CENTER): Payer: Medicare HMO | Admitting: Gastroenterology

## 2021-06-06 ENCOUNTER — Encounter: Payer: Self-pay | Admitting: Gastroenterology

## 2021-06-06 ENCOUNTER — Other Ambulatory Visit: Payer: Self-pay

## 2021-06-06 ENCOUNTER — Encounter: Payer: Medicare HMO | Admitting: Gastroenterology

## 2021-06-06 VITALS — BP 105/64 | HR 54 | Temp 98.2°F | Resp 11 | Ht 60.0 in | Wt 122.0 lb

## 2021-06-06 DIAGNOSIS — Z86010 Personal history of colonic polyps: Secondary | ICD-10-CM | POA: Diagnosis not present

## 2021-06-06 DIAGNOSIS — E7800 Pure hypercholesterolemia, unspecified: Secondary | ICD-10-CM | POA: Diagnosis not present

## 2021-06-06 DIAGNOSIS — K219 Gastro-esophageal reflux disease without esophagitis: Secondary | ICD-10-CM | POA: Diagnosis not present

## 2021-06-06 DIAGNOSIS — Z800 Family history of malignant neoplasm of digestive organs: Secondary | ICD-10-CM | POA: Diagnosis not present

## 2021-06-06 MED ORDER — SODIUM CHLORIDE 0.9 % IV SOLN: 500.0000 mL | Freq: Once | INTRAVENOUS | Status: AC

## 2021-06-06 NOTE — Progress Notes (Signed)
Report given to PACU, vss 

## 2021-06-06 NOTE — Patient Instructions (Signed)
° °  Try Miralax for constipation  Handout on hemorrhoids given to you today   YOU HAD AN ENDOSCOPIC PROCEDURE TODAY AT [facility]:   Refer to the procedure report that was given to you for any specific questions about what was found during the examination.  If the procedure report does not answer your questions, please call your gastroenterologist to clarify.  If you requested that your care partner not be given the details of your procedure findings, then the procedure report has been included in a sealed envelope for you to review at your convenience later.  YOU SHOULD EXPECT: Some feelings of bloating in the abdomen. Passage of more gas than usual.  Walking can help get rid of the air that was put into your GI tract during the procedure and reduce the bloating. If you had a lower endoscopy (such as a colonoscopy or flexible sigmoidoscopy) you may notice spotting of blood in your stool or on the toilet paper. If you underwent a bowel prep for your procedure, you may not have a normal bowel movement for a few days.  Please Note:  You might notice some irritation and congestion in your nose or some drainage.  This is from the oxygen used during your procedure.  There is no need for concern and it should clear up in a day or so.  SYMPTOMS TO REPORT IMMEDIATELY:  Following lower endoscopy (colonoscopy or flexible sigmoidoscopy):  Excessive amounts of blood in the stool  Significant tenderness or worsening of abdominal pains  Swelling of the abdomen that is new, acute  Fever of 100F or higher   For urgent or emergent issues, a gastroenterologist can be reached at any hour by calling [phone]. Do not use MyChart messaging for urgent concerns.    DIET:  We do recommend a small meal at first, but then you may proceed to your regular diet.  Drink plenty of fluids but you should avoid alcoholic beverages for 24 hours.  ACTIVITY:  You should plan to take it easy for the rest  of today and you should NOT DRIVE or use heavy machinery until tomorrow (because of the sedation medicines used during the test).    FOLLOW UP: Our staff will call the number listed on your records 48-72 hours following your procedure to check on you and address any questions or concerns that you may have regarding the information given to you following your procedure. If we do not reach you, we will leave a message.  We will attempt to reach you two times.  During this call, we will ask if you have developed any symptoms of COVID 19. If you develop any symptoms (ie: fever, flu-like symptoms, shortness of breath, cough etc.) before then, please call [phone].  If you test positive for Covid 19 in the 2 weeks post procedure, please call and report this information to [place].    If any biopsies were taken you will be contacted by phone or by letter within the next 1-3 weeks.  Please call us at [phone] if you have not heard about the biopsies in 3 weeks.    SIGNATURES/CONFIDENTIALITY: You and/or your care partner have signed paperwork which will be entered into your electronic medical record.  These signatures attest to the fact that that the information above on your After Visit Summary has been reviewed and is understood.  Full responsibility of the confidentiality of this discharge information lies with you and/or your care-partner.

## 2021-06-06 NOTE — Progress Notes (Signed)
[facility] History and Physical   Primary Care Physician:  Benito Mccreedy, MD   Reason for Procedure:   History colon polyps, FH of CRC cancer  Plan:    colonoscopy     HPI: Amber [name] is a 71 y.o. female  here for colonoscopy surveillance - last exam 11/2017 - 4 adenomas, father had CRC age 63s. Patient endorses occasional constipation. Otherwise feels well without any cardiopulmonary symptoms.    Past Medical History:  Diagnosis Date   Allergy    Arthritis    back   Cataract    begining   Chronic kidney disease    stones x2   GERD (gastroesophageal reflux disease)    Hyperlipemia    PONV (postoperative nausea and vomiting)    Vertigo, intermittent    s/p head injury d/t fall 31yrs ago    Past Surgical History:  Procedure Laterality Date   BACK SURGERY     Lumbar fusion  L 4 to L 5 60yrs ago   BREAST SURGERY     augmentation 42yrs ago   COLONOSCOPY  08/21/2012   CYSTOSCOPY/RETROGRADE/URETEROSCOPY/STONE EXTRACTION WITH BASKET  12/03/2011   Procedure: CYSTOSCOPY/RETROGRADE/URETEROSCOPY/STONE EXTRACTION WITH BASKET;  Surgeon: Malka So, MD;  Location: [facility];  Service: Urology;  Laterality: Right;  Right Ureteroscopy Stone Extraction   FACELIFT     17 yrs ago   LITHOTRIPSY     POLYPECTOMY     throat biopsy  5 yrs ago   TONSILLECTOMY     age 28    Prior to Admission medications   Medication Sig Start Date End Date Taking? Authorizing Provider  APPLE CIDER VINEGAR PO Take by mouth. As needed   Yes [provider]  ezetimibe (ZETIA) 10 MG tablet 1 tab(s) 02/16/21  Yes [provider]  meclizine (ANTIVERT) 25 MG tablet Take 1 tablet (25 mg total) by mouth 3 (three) times daily as needed. 12/06/16  Yes Tanna Furry, MD  VASCEPA 1 g capsule Take 1 g by mouth 3 (three) times daily. 04/21/21  Yes [provider]    Current Outpatient Medications  Medication Sig Dispense Refill   APPLE CIDER VINEGAR PO Take by mouth. As  needed     ezetimibe (ZETIA) 10 MG tablet 1 tab(s)     meclizine (ANTIVERT) 25 MG tablet Take 1 tablet (25 mg total) by mouth 3 (three) times daily as needed. 20 tablet 0   VASCEPA 1 g capsule Take 1 g by mouth 3 (three) times daily.     Current Facility-Administered Medications  Medication Dose Route Frequency Provider Last Rate Last Admin   0.9 %  sodium chloride infusion  500 mL Intravenous Once Aser Nylund, Carlota Raspberry, MD       0.9 %  sodium chloride infusion  500 mL Intravenous Once Sherlyn Ebbert, Carlota Raspberry, MD        Allergies as of 06/06/2021 - Review Complete 06/06/2021  Allergen Reaction Noted   Codeine Nausea And Vomiting 11/25/2011    Family History  Problem Relation Age of Onset   Pancreatic cancer Mother    Colon cancer Father        80's   Lung cancer Father    Breast cancer Sister    Esophageal cancer Neg Hx    Stomach cancer Neg Hx    Rectal cancer Neg Hx     Social History   Socioeconomic History   Marital status: Married    Spouse name: Not on file   Number  of children: Not on file   Years of education: Not on file   Highest education level: Not on file  Occupational History   Not on file  Tobacco Use   Smoking status: Former   Smokeless tobacco: Never  Vaping Use   Vaping Use: Never used  Substance and Sexual Activity   Alcohol use: Yes    Alcohol/week: 3.0 standard drinks    Types: 3 Glasses of wine per week    Comment: 3-4 times a week,wine   Drug use: No   Sexual activity: Not on file  Other Topics Concern   Not on file  Social History Narrative   Not on file   Social Determinants of Health   Financial Resource Strain: Not on file  Food Insecurity: Not on file  Transportation Needs: Not on file  Physical Activity: Not on file  Stress: Not on file  Social Connections: Not on file  Intimate Partner Violence: Not on file    Review of Systems: All other review of systems negative except as mentioned in the HPI.  Physical Exam: Vital  signs BP 137/77    Pulse 60    Temp 98.2 F (36.8 C)    Resp 11    Ht 5' (1.524 m)    Wt 122 lb (55.3 kg)    SpO2 99%    BMI 23.83 kg/m   General:   Alert,  Well-developed, pleasant and cooperative in NAD Lungs:  Clear throughout to auscultation.   Heart:  Regular rate and rhythm Abdomen:  Soft, nontender and nondistended.   Neuro/Psych:  Alert and cooperative. Normal mood and affect. A and O x 3  Jolly Mango, MD [facility]

## 2021-06-06 NOTE — Progress Notes (Signed)
r 

## 2021-06-06 NOTE — Op Note (Signed)
[facility] Patient Name: Amber [name] Procedure Date: 06/06/2021 7:18 AM MRN: [id number] Endoscopist: Remo Lipps P. Havery Moros , MD Age: 71 Referring MD:  Date of Birth: [birth year] Gender: Female Account #: [id number] Procedure:                Colonoscopy Indications:              High risk colon cancer surveillance: Personal                            history of colonic polyps - 4 small adenomas                            removed 11/2017, father with colon cancer dx age 5s Medicines:                Monitored Anesthesia Care Procedure:                Pre-Anesthesia Assessment:                           - Prior to the procedure, a History and Physical                            was performed, and patient medications and                            allergies were reviewed. The patient's tolerance of                            previous anesthesia was also reviewed. The risks                            and benefits of the procedure and the sedation                            options and risks were discussed with the patient.                            All questions were answered, and informed consent                            was obtained. Prior Anticoagulants: The patient has                            taken no previous anticoagulant or antiplatelet                            agents. ASA Grade Assessment: II - A patient with                            mild systemic disease. After reviewing the risks                            and benefits, the patient was deemed in  satisfactory condition to undergo the procedure.                           After obtaining informed consent, the colonoscope                            was passed under direct vision. Throughout the                            procedure, the patient's blood pressure, pulse, and                            oxygen saturations were monitored continuously. The                            Olympus  Colonoscope [id number] was introduced through                            the anus and advanced to the the cecum, identified                            by appendiceal orifice and ileocecal valve. The                            colonoscopy was performed without difficulty. The                            patient tolerated the procedure well. The quality                            of the bowel preparation was good. The ileocecal                            valve, appendiceal orifice, and rectum were                            photographed. Scope In: 7:42:08 AM Scope Out: 7:56:55 AM Scope Withdrawal Time: 0 hours 11 minutes 4 seconds  Total Procedure Duration: 0 hours 14 minutes 47 seconds  Findings:                 The perianal and digital rectal examinations were                            normal.                           The colon was tortuous.                           Internal hemorrhoids were found. The hemorrhoids                            were small.  The exam was otherwise without abnormality. Of                            note, retroflexed views of the rectum not obtained                            due to small size of the rectum. Complications:            No immediate complications. Estimated blood loss:                            None. Estimated Blood Loss:     Estimated blood loss: none. Impression:               - Tortuous colon.                           - Internal hemorrhoids.                           - The examination was otherwise normal.                           - No polyps. Recommendation:           - Patient has a contact number available for                            emergencies. The signs and symptoms of potential                            delayed complications were discussed with the                            patient. Return to normal activities tomorrow.                            Written discharge instructions were provided to the                             patient.                           - Resume previous diet.                           - Continue present medications.                           - Trial of Miralax PRN for constipation                           - Per guidelines, would not be due for another                            colonoscopy for 10 years, at that time the patient  will be 71 years old, an age at which risks likely                            outweigh benefits for routine surveillance. In this                            light, likely no further colonoscopy exams are                            recommend. Remo Lipps P. Gibril Mastro, MD 06/06/2021 8:03:12 AM This report has been signed electronically.

## 2021-06-08 ENCOUNTER — Telehealth: Payer: Self-pay | Admitting: *Deleted

## 2021-06-08 NOTE — Telephone Encounter (Signed)
°  Follow up Call-  Call back number 06/06/2021  Post procedure Call Back phone  # [phone]  Permission to leave phone message Yes  Some recent data might be hidden     Patient questions:  Do you have a fever, pain , or abdominal swelling? No. Pain Score  0 *  Have you tolerated food without any problems? Yes.    Have you been able to return to your normal activities? Yes.    Do you have any questions about your discharge instructions: Diet   No. Medications  No. Follow up visit  No.  Do you have questions or concerns about your Care? No.  Actions: * If pain score is 4 or above: No action needed, pain <4.  Have you developed a fever since your procedure? no  2.   Have you had an respiratory symptoms (SOB or cough) since your procedure? no  3.   Have you tested positive for COVID 19 since your procedure no  4.   Have you had any family members/close contacts diagnosed with the COVID 19 since your procedure?  no   If yes to any of these questions please route to Joylene John, RN and Joella Prince, RN

## 2021-06-14 DIAGNOSIS — R5383 Other fatigue: Secondary | ICD-10-CM | POA: Diagnosis not present

## 2021-06-19 DIAGNOSIS — H43813 Vitreous degeneration, bilateral: Secondary | ICD-10-CM | POA: Diagnosis not present

## 2021-06-19 DIAGNOSIS — D3132 Benign neoplasm of left choroid: Secondary | ICD-10-CM | POA: Diagnosis not present

## 2021-06-19 DIAGNOSIS — H25813 Combined forms of age-related cataract, bilateral: Secondary | ICD-10-CM | POA: Diagnosis not present

## 2021-06-19 DIAGNOSIS — H353131 Nonexudative age-related macular degeneration, bilateral, early dry stage: Secondary | ICD-10-CM | POA: Diagnosis not present

## 2021-07-03 DIAGNOSIS — H353132 Nonexudative age-related macular degeneration, bilateral, intermediate dry stage: Secondary | ICD-10-CM | POA: Diagnosis not present

## 2021-07-03 DIAGNOSIS — D3132 Benign neoplasm of left choroid: Secondary | ICD-10-CM | POA: Diagnosis not present

## 2021-07-03 DIAGNOSIS — H35372 Puckering of macula, left eye: Secondary | ICD-10-CM | POA: Diagnosis not present

## 2021-07-03 DIAGNOSIS — H43813 Vitreous degeneration, bilateral: Secondary | ICD-10-CM | POA: Diagnosis not present

## 2021-07-03 DIAGNOSIS — H2513 Age-related nuclear cataract, bilateral: Secondary | ICD-10-CM | POA: Diagnosis not present

## 2021-07-19 DIAGNOSIS — B0223 Postherpetic polyneuropathy: Secondary | ICD-10-CM | POA: Diagnosis not present

## 2021-07-19 DIAGNOSIS — N3000 Acute cystitis without hematuria: Secondary | ICD-10-CM | POA: Diagnosis not present

## 2021-07-19 DIAGNOSIS — R110 Nausea: Secondary | ICD-10-CM | POA: Diagnosis not present

## 2021-09-03 DIAGNOSIS — H35372 Puckering of macula, left eye: Secondary | ICD-10-CM | POA: Diagnosis not present

## 2021-09-03 DIAGNOSIS — H25043 Posterior subcapsular polar age-related cataract, bilateral: Secondary | ICD-10-CM | POA: Diagnosis not present

## 2021-09-03 DIAGNOSIS — H2513 Age-related nuclear cataract, bilateral: Secondary | ICD-10-CM | POA: Diagnosis not present

## 2021-09-03 DIAGNOSIS — H353131 Nonexudative age-related macular degeneration, bilateral, early dry stage: Secondary | ICD-10-CM | POA: Diagnosis not present

## 2021-09-03 DIAGNOSIS — H2512 Age-related nuclear cataract, left eye: Secondary | ICD-10-CM | POA: Diagnosis not present

## 2021-09-03 DIAGNOSIS — H25013 Cortical age-related cataract, bilateral: Secondary | ICD-10-CM | POA: Diagnosis not present

## 2021-09-27 ENCOUNTER — Encounter: Payer: Self-pay | Admitting: Gastroenterology

## 2021-10-02 DIAGNOSIS — H25812 Combined forms of age-related cataract, left eye: Secondary | ICD-10-CM | POA: Diagnosis not present

## 2021-10-02 DIAGNOSIS — H2512 Age-related nuclear cataract, left eye: Secondary | ICD-10-CM | POA: Diagnosis not present

## 2021-10-02 DIAGNOSIS — H2511 Age-related nuclear cataract, right eye: Secondary | ICD-10-CM | POA: Diagnosis not present

## 2021-10-09 DIAGNOSIS — H25811 Combined forms of age-related cataract, right eye: Secondary | ICD-10-CM | POA: Diagnosis not present

## 2021-10-09 DIAGNOSIS — H2511 Age-related nuclear cataract, right eye: Secondary | ICD-10-CM | POA: Diagnosis not present

## 2021-10-17 DIAGNOSIS — H353131 Nonexudative age-related macular degeneration, bilateral, early dry stage: Secondary | ICD-10-CM | POA: Diagnosis not present

## 2021-10-17 DIAGNOSIS — H35372 Puckering of macula, left eye: Secondary | ICD-10-CM | POA: Diagnosis not present

## 2021-11-12 DIAGNOSIS — H04123 Dry eye syndrome of bilateral lacrimal glands: Secondary | ICD-10-CM | POA: Diagnosis not present

## 2021-12-25 DIAGNOSIS — R438 Other disturbances of smell and taste: Secondary | ICD-10-CM | POA: Diagnosis not present

## 2021-12-25 DIAGNOSIS — L03012 Cellulitis of left finger: Secondary | ICD-10-CM | POA: Diagnosis not present

## 2021-12-25 DIAGNOSIS — E7800 Pure hypercholesterolemia, unspecified: Secondary | ICD-10-CM | POA: Diagnosis not present

## 2022-01-03 DIAGNOSIS — Z1231 Encounter for screening mammogram for malignant neoplasm of breast: Secondary | ICD-10-CM | POA: Diagnosis not present

## 2022-01-03 DIAGNOSIS — Z01419 Encounter for gynecological examination (general) (routine) without abnormal findings: Secondary | ICD-10-CM | POA: Diagnosis not present

## 2022-01-03 DIAGNOSIS — Z6826 Body mass index (BMI) 26.0-26.9, adult: Secondary | ICD-10-CM | POA: Diagnosis not present

## 2022-01-09 DIAGNOSIS — H26492 Other secondary cataract, left eye: Secondary | ICD-10-CM | POA: Diagnosis not present

## 2022-01-09 DIAGNOSIS — H04123 Dry eye syndrome of bilateral lacrimal glands: Secondary | ICD-10-CM | POA: Diagnosis not present

## 2022-01-09 DIAGNOSIS — H18413 Arcus senilis, bilateral: Secondary | ICD-10-CM | POA: Diagnosis not present

## 2022-01-09 DIAGNOSIS — H35372 Puckering of macula, left eye: Secondary | ICD-10-CM | POA: Diagnosis not present

## 2022-01-09 DIAGNOSIS — H353131 Nonexudative age-related macular degeneration, bilateral, early dry stage: Secondary | ICD-10-CM | POA: Diagnosis not present

## 2022-01-29 DIAGNOSIS — H9313 Tinnitus, bilateral: Secondary | ICD-10-CM | POA: Diagnosis not present

## 2022-01-29 DIAGNOSIS — R438 Other disturbances of smell and taste: Secondary | ICD-10-CM | POA: Diagnosis not present

## 2022-01-29 DIAGNOSIS — R430 Anosmia: Secondary | ICD-10-CM | POA: Diagnosis not present

## 2022-02-06 DIAGNOSIS — H26491 Other secondary cataract, right eye: Secondary | ICD-10-CM | POA: Diagnosis not present

## 2022-03-29 DIAGNOSIS — B0223 Postherpetic polyneuropathy: Secondary | ICD-10-CM | POA: Diagnosis not present

## 2022-03-29 DIAGNOSIS — E7800 Pure hypercholesterolemia, unspecified: Secondary | ICD-10-CM | POA: Diagnosis not present

## 2022-03-29 DIAGNOSIS — Z0001 Encounter for general adult medical examination with abnormal findings: Secondary | ICD-10-CM | POA: Diagnosis not present

## 2022-04-26 DIAGNOSIS — Z131 Encounter for screening for diabetes mellitus: Secondary | ICD-10-CM | POA: Diagnosis not present

## 2022-04-26 DIAGNOSIS — Z136 Encounter for screening for cardiovascular disorders: Secondary | ICD-10-CM | POA: Diagnosis not present

## 2022-04-26 DIAGNOSIS — E7800 Pure hypercholesterolemia, unspecified: Secondary | ICD-10-CM | POA: Diagnosis not present

## 2022-04-26 DIAGNOSIS — Z0001 Encounter for general adult medical examination with abnormal findings: Secondary | ICD-10-CM | POA: Diagnosis not present

## 2022-04-26 DIAGNOSIS — R739 Hyperglycemia, unspecified: Secondary | ICD-10-CM | POA: Diagnosis not present

## 2022-08-29 DIAGNOSIS — E7800 Pure hypercholesterolemia, unspecified: Secondary | ICD-10-CM | POA: Diagnosis not present

## 2023-02-06 DIAGNOSIS — Z131 Encounter for screening for diabetes mellitus: Secondary | ICD-10-CM | POA: Diagnosis not present

## 2023-02-06 DIAGNOSIS — E7800 Pure hypercholesterolemia, unspecified: Secondary | ICD-10-CM | POA: Diagnosis not present

## 2023-02-06 DIAGNOSIS — Z0001 Encounter for general adult medical examination with abnormal findings: Secondary | ICD-10-CM | POA: Diagnosis not present

## 2023-03-31 DIAGNOSIS — E7800 Pure hypercholesterolemia, unspecified: Secondary | ICD-10-CM | POA: Diagnosis not present

## 2023-03-31 DIAGNOSIS — Z0001 Encounter for general adult medical examination with abnormal findings: Secondary | ICD-10-CM | POA: Diagnosis not present

## 2023-09-01 DIAGNOSIS — E7800 Pure hypercholesterolemia, unspecified: Secondary | ICD-10-CM | POA: Diagnosis not present

## 2023-09-01 DIAGNOSIS — Z131 Encounter for screening for diabetes mellitus: Secondary | ICD-10-CM | POA: Diagnosis not present

## 2023-09-01 DIAGNOSIS — Z0001 Encounter for general adult medical examination with abnormal findings: Secondary | ICD-10-CM | POA: Diagnosis not present

## 2023-12-29 DIAGNOSIS — Z131 Encounter for screening for diabetes mellitus: Secondary | ICD-10-CM | POA: Diagnosis not present

## 2023-12-29 DIAGNOSIS — R55 Syncope and collapse: Secondary | ICD-10-CM | POA: Diagnosis not present

## 2023-12-29 DIAGNOSIS — Z0001 Encounter for general adult medical examination with abnormal findings: Secondary | ICD-10-CM | POA: Diagnosis not present

## 2023-12-29 DIAGNOSIS — E7800 Pure hypercholesterolemia, unspecified: Secondary | ICD-10-CM | POA: Diagnosis not present

## 2024-01-12 DIAGNOSIS — N3000 Acute cystitis without hematuria: Secondary | ICD-10-CM | POA: Diagnosis not present

## 2024-01-12 DIAGNOSIS — E7800 Pure hypercholesterolemia, unspecified: Secondary | ICD-10-CM | POA: Diagnosis not present

## 2024-03-15 DIAGNOSIS — M25561 Pain in right knee: Secondary | ICD-10-CM | POA: Diagnosis not present

## 2024-03-23 DIAGNOSIS — M1711 Unilateral primary osteoarthritis, right knee: Secondary | ICD-10-CM | POA: Diagnosis not present

## 2024-03-24 ENCOUNTER — Ambulatory Visit: Admitting: Cardiology

## 2024-04-21 ENCOUNTER — Encounter: Payer: Self-pay | Admitting: Cardiology

## 2024-04-21 ENCOUNTER — Ambulatory Visit: Attending: Cardiology | Admitting: Cardiology

## 2024-04-21 VITALS — BP 142/71 | HR 62 | Resp 98 | Ht 60.0 in | Wt 134.0 lb

## 2024-04-21 DIAGNOSIS — R55 Syncope and collapse: Secondary | ICD-10-CM | POA: Diagnosis not present

## 2024-04-21 NOTE — Patient Instructions (Signed)
 Follow-Up: At [facility], you and your health needs are our priority.  As part of our continuing mission to provide you with exceptional heart care, our providers are all part of one team.  This team includes your primary Cardiologist (physician) and Advanced Practice Providers or APPs (Physician Assistants and Nurse Practitioners) who all work together to provide you with the care you need, when you need it.  Your next appointment:   As needed  Provider:   Cody Das, MD

## 2024-04-21 NOTE — Progress Notes (Signed)
 Cardiology Office Note:  .   Date:  04/21/2024  ID:  Amber [name], DOB 07-03-1950, MRN [id number] PCP: Catalina Bare, MD  [facility] Providers Cardiologist:  Newman Lawrence, MD PCP: Catalina Bare, MD  Chief Complaint  Patient presents with   Syncope     Amber [name] is a 73 y.o. female with syncope  Discussed the use of AI scribe software for clinical note transcription with the patient, who gave verbal consent to proceed.  History of Present Illness Amber [name] is a 72 year old female who presents with a syncopal episode associated with diarrhea. She was referred by Rosina and Dr. Orbie for evaluation of her syncopal episode.  Approximately three months ago, she had a syncopal episode while having severe diarrhea in the bathroom. She lost consciousness, was found by her husband, and later recalled prior near-blackout sensations with diarrhea, though this was the first complete loss of consciousness. Her blood pressure was initially unobtainable but later normalized. She denies chest pain, shortness of breath, or palpitations with these events.  She has intermittent severe diarrhea, sometimes requiring diapers. She uses medication as needed when it worsens and has not seen a gastroenterologist.  She had vertigo after a head injury 21 years ago that resolved after an Epley maneuver. She keeps meclizine  available and uses it as needed.  Her current medications are Zetia and Vascepa 1 gram three times daily. She takes no other regular medications.  She lives in [place], is active caring for her grandchildren, and does not smoke or drink regularly.      Vitals:   04/21/24 0952  BP: (!) 142/71  Pulse: 62  Resp: (!) 98    Orthostatic VS for the past 72 hrs (Last 3 readings):  Orthostatic BP Patient Position BP Location Cuff Size Orthostatic Pulse  04/21/24 1006 135/69 Standing Right Arm Normal 75  04/21/24 1005 127/75 Standing Right  Arm Normal 80  04/21/24 1004 142/71 Sitting Right Arm Normal 74     Review of Systems  Cardiovascular:  Negative for chest pain, dyspnea on exertion, leg swelling, palpitations and syncope.        Studies Reviewed: SABRA        EKG 04/21/2024: Normal sinus rhythm Normal ECG When compared with ECG of 06-Dec-2016 09:00, No significant change was found      Physical Exam Vitals and nursing note reviewed.  Constitutional:      General: She is not in acute distress. Neck:     Vascular: No JVD.  Cardiovascular:     Rate and Rhythm: Normal rate and regular rhythm.     Heart sounds: Normal heart sounds. No murmur heard. Pulmonary:     Effort: Pulmonary effort is normal.     Breath sounds: Normal breath sounds. No wheezing or rales.  Musculoskeletal:     Right lower leg: No edema.     Left lower leg: No edema.      VISIT DIAGNOSES:   ICD-10-CM   1. Syncope and collapse  R55 EKG 12-Lead       Amber [name] is a 73 y.o. female with syncope  Assessment & Plan Syncope secondary to diarrhea and vasovagal response: Syncope likely due to dehydration and vasovagal response. No recurrent episodes or cardiac issues suspected. - No further cardiac workup needed.  Irritable bowel syndrome with diarrhea: Intermittent diarrhea affecting quality of life, suggestive of irritable bowel syndrome. No anatomical abnormalities suspected. - Discuss with primary care physician or gastroenterologist  for management options. - Consider dietary modifications and medications to manage diarrhea.  Patient is on Zetia and Vascepa.  I do not have recent lipid panel results.  Defer to PCP.  If not controlled, happy to opine with further recommendations.  F/u as needed  Signed, Newman JINNY Lawrence, MD
